# Patient Record
Sex: Female | Born: 1965 | Race: Black or African American | Hispanic: No | Marital: Single | Smoking: Never smoker
Health system: Southern US, Community
[De-identification: ages and names within clinical notes are randomized; demographics above are authoritative.]

## PROBLEM LIST (undated history)

## (undated) DIAGNOSIS — I10 Essential (primary) hypertension: Secondary | ICD-10-CM

## (undated) DIAGNOSIS — K219 Gastro-esophageal reflux disease without esophagitis: Secondary | ICD-10-CM

## (undated) DIAGNOSIS — R519 Headache, unspecified: Secondary | ICD-10-CM

## (undated) DIAGNOSIS — D649 Anemia, unspecified: Secondary | ICD-10-CM

## (undated) DIAGNOSIS — R6883 Chills (without fever): Secondary | ICD-10-CM

## (undated) DIAGNOSIS — R51 Headache: Secondary | ICD-10-CM

## (undated) HISTORY — PX: COLONOSCOPY: SHX174

## (undated) HISTORY — PX: APPENDECTOMY: SHX54

---

## 1995-12-04 HISTORY — PX: OOPHORECTOMY: SHX86

## 1999-07-18 ENCOUNTER — Other Ambulatory Visit: Admission: RE | Admit: 1999-07-18 | Discharge: 1999-07-18 | Payer: Self-pay | Admitting: *Deleted

## 2000-11-15 ENCOUNTER — Other Ambulatory Visit: Admission: RE | Admit: 2000-11-15 | Discharge: 2000-11-15 | Payer: Self-pay | Admitting: *Deleted

## 2001-02-27 ENCOUNTER — Other Ambulatory Visit: Admission: RE | Admit: 2001-02-27 | Discharge: 2001-02-27 | Payer: Self-pay | Admitting: *Deleted

## 2001-03-06 ENCOUNTER — Encounter: Admission: RE | Admit: 2001-03-06 | Discharge: 2001-03-06 | Payer: Self-pay | Admitting: *Deleted

## 2001-03-06 ENCOUNTER — Encounter: Payer: Self-pay | Admitting: *Deleted

## 2001-08-12 ENCOUNTER — Encounter: Admission: RE | Admit: 2001-08-12 | Discharge: 2001-08-12 | Payer: Self-pay | Admitting: Internal Medicine

## 2001-08-12 ENCOUNTER — Encounter: Payer: Self-pay | Admitting: Internal Medicine

## 2001-08-12 ENCOUNTER — Encounter (INDEPENDENT_AMBULATORY_CARE_PROVIDER_SITE_OTHER): Payer: Self-pay | Admitting: *Deleted

## 2001-11-27 ENCOUNTER — Other Ambulatory Visit: Admission: RE | Admit: 2001-11-27 | Discharge: 2001-11-27 | Payer: Self-pay | Admitting: Obstetrics and Gynecology

## 2002-01-21 ENCOUNTER — Encounter: Payer: Self-pay | Admitting: *Deleted

## 2002-01-21 ENCOUNTER — Encounter: Admission: RE | Admit: 2002-01-21 | Discharge: 2002-01-21 | Payer: Self-pay | Admitting: *Deleted

## 2002-05-29 ENCOUNTER — Other Ambulatory Visit: Admission: RE | Admit: 2002-05-29 | Discharge: 2002-05-29 | Payer: Self-pay | Admitting: Obstetrics and Gynecology

## 2002-12-01 ENCOUNTER — Other Ambulatory Visit: Admission: RE | Admit: 2002-12-01 | Discharge: 2002-12-01 | Payer: Self-pay | Admitting: Obstetrics and Gynecology

## 2003-11-16 ENCOUNTER — Other Ambulatory Visit: Admission: RE | Admit: 2003-11-16 | Discharge: 2003-11-16 | Payer: Self-pay | Admitting: Obstetrics and Gynecology

## 2004-11-14 ENCOUNTER — Encounter: Admission: RE | Admit: 2004-11-14 | Discharge: 2004-11-14 | Payer: Self-pay | Admitting: Obstetrics and Gynecology

## 2004-12-11 ENCOUNTER — Encounter: Admission: RE | Admit: 2004-12-11 | Discharge: 2004-12-11 | Payer: Self-pay | Admitting: Obstetrics and Gynecology

## 2006-09-18 ENCOUNTER — Encounter: Admission: RE | Admit: 2006-09-18 | Discharge: 2006-09-18 | Payer: Self-pay | Admitting: *Deleted

## 2007-06-10 ENCOUNTER — Encounter: Admission: RE | Admit: 2007-06-10 | Discharge: 2007-06-10 | Payer: Self-pay | Admitting: *Deleted

## 2007-11-07 ENCOUNTER — Encounter: Admission: RE | Admit: 2007-11-07 | Discharge: 2007-11-07 | Payer: Self-pay | Admitting: Obstetrics and Gynecology

## 2008-11-18 ENCOUNTER — Encounter: Admission: RE | Admit: 2008-11-18 | Discharge: 2008-11-18 | Payer: Self-pay | Admitting: Obstetrics and Gynecology

## 2009-11-21 ENCOUNTER — Encounter: Admission: RE | Admit: 2009-11-21 | Discharge: 2009-11-21 | Payer: Self-pay | Admitting: Obstetrics and Gynecology

## 2010-11-24 ENCOUNTER — Encounter
Admission: RE | Admit: 2010-11-24 | Discharge: 2010-11-24 | Payer: Self-pay | Source: Home / Self Care | Attending: Obstetrics and Gynecology | Admitting: Obstetrics and Gynecology

## 2011-10-18 ENCOUNTER — Other Ambulatory Visit: Payer: Self-pay

## 2011-10-18 ENCOUNTER — Encounter: Payer: Self-pay | Admitting: Cardiology

## 2011-10-18 ENCOUNTER — Emergency Department (HOSPITAL_COMMUNITY)
Admission: EM | Admit: 2011-10-18 | Discharge: 2011-10-18 | Disposition: A | Discharge status: Home / Self Care | Payer: 59 | Source: Home / Self Care | Attending: Family Medicine | Admitting: Family Medicine

## 2011-10-18 DIAGNOSIS — R0781 Pleurodynia: Secondary | ICD-10-CM

## 2011-10-18 DIAGNOSIS — R071 Chest pain on breathing: Secondary | ICD-10-CM

## 2011-10-18 DIAGNOSIS — R0789 Other chest pain: Secondary | ICD-10-CM

## 2011-10-18 MED ORDER — NAPROXEN 500 MG PO TABS: 500.0000 mg | ORAL_TABLET | Freq: Two times a day (BID) | ORAL | Status: AC

## 2011-10-18 NOTE — ED Notes (Addendum)
Along with chest pain as noted in previous note pt reports she had an episode of dizziness Monday that last for 1 1/2 hrs that started at 530pm.  Pt states she has taken motrin 400mg  this morning with no relief. Pt states it hurts her to take a a deep breath. Denies fever. Pt states her initial chest pain at 10pm last night felt like someone was standing on her chest. Pain has eased up but has not gone away. Last night she had increased pain when raising her arms.  Pain is in her chest and goes into the center of her back.

## 2011-10-18 NOTE — ED Notes (Signed)
Pt. states she's been having chest pain since 10pm yesterday. Pt is orient x3 and speaking full sentences.  Pt states she only has pain in the center of the chest, when she breaths in. The pain does not radiate anywhere else. Pt has no cardiac history and is not taking any medications.

## 2011-10-18 NOTE — ED Provider Notes (Signed)
History     CSN: [id number] Arrival date & time: 10/18/2011 10:58 AM   First MD Initiated Contact with Patient 10/18/11 1116      Chief Complaint  Patient presents with  . Chest Pain    (Consider location/radiation/quality/duration/timing/severity/associated sxs/prior treatment) Patient is a 45 y.o. female presenting with chest pain. The history is provided by the patient.  Chest Pain The chest pain began 12 - 24 hours ago. Chest pain occurs constantly. The chest pain is unchanged. The pain is associated with breathing, coughing and exertion. At its most intense, the pain is at 8/10. The pain is currently at 0/10. The quality of the pain is described as dull and aching. The pain radiates to the upper back and mid back. Chest pain is worsened by certain positions, deep breathing and exertion. Primary symptoms include shortness of breath and cough.     History reviewed. No pertinent past medical history.  History reviewed. No pertinent past surgical history.  Family History  Problem Relation Age of Onset  . Hypertension Mother     History  Substance Use Topics  . Smoking status: Never Smoker   . Smokeless tobacco: Not on file  . Alcohol Use: No    OB History    Grav Para Term Preterm Abortions TAB SAB Ect Mult Living                  Review of Systems  Constitutional: Negative.   Respiratory: Positive for cough and shortness of breath.   Cardiovascular: Positive for chest pain.  Genitourinary: Negative.   Musculoskeletal: Negative.     Allergies  Review of patient's allergies indicates no known allergies.  Home Medications  No current outpatient prescriptions on file.  BP 142/79  Pulse 92  Temp 97.9 F (36.6 C)  Resp 20  SpO2 100%  Physical Exam  Constitutional: She appears well-developed and well-nourished.  HENT:  Head: Normocephalic and atraumatic.  Eyes: EOM are normal. Pupils are equal, round, and reactive to light.  Neck: Normal range of  motion.  Cardiovascular: Normal rate, regular rhythm and normal heart sounds.  Exam reveals no gallop and no friction rub.   No murmur heard. Pulmonary/Chest: Effort normal and breath sounds normal. She has no wheezes. She has no rales. She exhibits tenderness.      ED Course  Procedures (including critical care time)  Labs Reviewed - No data to display No results found.   No diagnosis found.    MDM  EKG: normal sinus rhythm, rate 72; no ST-T changes, no TWI        Richardo Priest, MD 10/18/11 1223

## 2011-11-13 ENCOUNTER — Other Ambulatory Visit: Payer: Self-pay | Admitting: Obstetrics and Gynecology

## 2011-11-13 DIAGNOSIS — Z1231 Encounter for screening mammogram for malignant neoplasm of breast: Secondary | ICD-10-CM

## 2011-11-29 ENCOUNTER — Ambulatory Visit
Admission: RE | Admit: 2011-11-29 | Discharge: 2011-11-29 | Disposition: A | Discharge status: Home / Self Care | Payer: 59 | Source: Ambulatory Visit | Attending: Obstetrics and Gynecology | Admitting: Obstetrics and Gynecology

## 2011-11-29 DIAGNOSIS — Z1231 Encounter for screening mammogram for malignant neoplasm of breast: Secondary | ICD-10-CM

## 2011-12-11 ENCOUNTER — Other Ambulatory Visit: Payer: Self-pay | Admitting: Obstetrics and Gynecology

## 2011-12-11 DIAGNOSIS — R928 Other abnormal and inconclusive findings on diagnostic imaging of breast: Secondary | ICD-10-CM

## 2011-12-20 ENCOUNTER — Ambulatory Visit
Admission: RE | Admit: 2011-12-20 | Discharge: 2011-12-20 | Disposition: A | Discharge status: Home / Self Care | Payer: 59 | Source: Ambulatory Visit | Attending: Obstetrics and Gynecology | Admitting: Obstetrics and Gynecology

## 2011-12-20 DIAGNOSIS — R928 Other abnormal and inconclusive findings on diagnostic imaging of breast: Secondary | ICD-10-CM

## 2012-07-01 ENCOUNTER — Encounter (HOSPITAL_BASED_OUTPATIENT_CLINIC_OR_DEPARTMENT_OTHER): Payer: 59

## 2012-11-11 ENCOUNTER — Other Ambulatory Visit: Payer: Self-pay | Admitting: Obstetrics and Gynecology

## 2012-11-11 DIAGNOSIS — Z1231 Encounter for screening mammogram for malignant neoplasm of breast: Secondary | ICD-10-CM

## 2013-10-02 ENCOUNTER — Other Ambulatory Visit: Payer: Self-pay

## 2013-10-02 DIAGNOSIS — Z1231 Encounter for screening mammogram for malignant neoplasm of breast: Secondary | ICD-10-CM

## 2013-11-02 ENCOUNTER — Ambulatory Visit
Admission: RE | Admit: 2013-11-02 | Discharge: 2013-11-02 | Disposition: A | Discharge status: Home / Self Care | Payer: 59 | Source: Ambulatory Visit

## 2013-11-02 DIAGNOSIS — Z1231 Encounter for screening mammogram for malignant neoplasm of breast: Secondary | ICD-10-CM

## 2014-09-07 ENCOUNTER — Other Ambulatory Visit: Payer: Self-pay

## 2014-09-07 DIAGNOSIS — Z1239 Encounter for other screening for malignant neoplasm of breast: Secondary | ICD-10-CM

## 2014-11-04 ENCOUNTER — Ambulatory Visit
Admission: RE | Admit: 2014-11-04 | Discharge: 2014-11-04 | Disposition: A | Discharge status: Home / Self Care | Payer: 59 | Source: Ambulatory Visit

## 2014-11-04 ENCOUNTER — Encounter (INDEPENDENT_AMBULATORY_CARE_PROVIDER_SITE_OTHER): Payer: Self-pay

## 2014-11-04 ENCOUNTER — Other Ambulatory Visit: Payer: Self-pay

## 2014-11-04 DIAGNOSIS — Z1231 Encounter for screening mammogram for malignant neoplasm of breast: Secondary | ICD-10-CM

## 2015-07-26 ENCOUNTER — Other Ambulatory Visit: Payer: Self-pay | Admitting: Certified Nurse Midwife

## 2015-07-26 DIAGNOSIS — D259 Leiomyoma of uterus, unspecified: Secondary | ICD-10-CM

## 2015-09-15 ENCOUNTER — Other Ambulatory Visit: Payer: Self-pay

## 2015-09-23 ENCOUNTER — Ambulatory Visit
Admission: RE | Admit: 2015-09-23 | Discharge: 2015-09-23 | Disposition: A | Discharge status: Home / Self Care | Payer: 59 | Source: Ambulatory Visit | Attending: Certified Nurse Midwife | Admitting: Certified Nurse Midwife

## 2015-09-23 DIAGNOSIS — D259 Leiomyoma of uterus, unspecified: Secondary | ICD-10-CM

## 2015-09-23 MED ORDER — GADOBENATE DIMEGLUMINE 529 MG/ML IV SOLN: 20.0000 mL | Freq: Once | INTRAVENOUS | Status: DC | PRN

## 2015-10-31 ENCOUNTER — Other Ambulatory Visit: Payer: Self-pay

## 2015-10-31 DIAGNOSIS — Z1231 Encounter for screening mammogram for malignant neoplasm of breast: Secondary | ICD-10-CM

## 2015-11-07 ENCOUNTER — Ambulatory Visit
Admission: RE | Admit: 2015-11-07 | Discharge: 2015-11-07 | Disposition: A | Discharge status: Home / Self Care | Payer: 59 | Source: Ambulatory Visit

## 2015-11-07 DIAGNOSIS — Z1231 Encounter for screening mammogram for malignant neoplasm of breast: Secondary | ICD-10-CM

## 2016-09-03 ENCOUNTER — Encounter (HOSPITAL_COMMUNITY): Payer: Self-pay | Admitting: *Deleted

## 2016-09-17 ENCOUNTER — Other Ambulatory Visit: Payer: Self-pay | Admitting: Obstetrics & Gynecology

## 2016-09-19 ENCOUNTER — Encounter (HOSPITAL_COMMUNITY)
Admission: RE | Admit: 2016-09-19 | Discharge: 2016-09-19 | Disposition: A | Discharge status: Home / Self Care | Payer: 59 | Source: Ambulatory Visit | Attending: Obstetrics & Gynecology | Admitting: Obstetrics & Gynecology

## 2016-09-19 ENCOUNTER — Encounter (HOSPITAL_COMMUNITY): Payer: Self-pay

## 2016-09-19 DIAGNOSIS — Z01812 Encounter for preprocedural laboratory examination: Secondary | ICD-10-CM | POA: Insufficient documentation

## 2016-09-19 HISTORY — DX: Anemia, unspecified: D64.9

## 2016-09-19 HISTORY — DX: Gastro-esophageal reflux disease without esophagitis: K21.9

## 2016-09-19 HISTORY — DX: Headache: R51

## 2016-09-19 HISTORY — DX: Essential (primary) hypertension: I10

## 2016-09-19 HISTORY — DX: Chills (without fever): R68.83

## 2016-09-19 HISTORY — DX: Headache, unspecified: R51.9

## 2016-09-19 LAB — CBC
HEMATOCRIT: 35.7 % — AB (ref 36.0–46.0)
Hemoglobin: 11.8 g/dL — ABNORMAL LOW (ref 12.0–15.0)
MCH: 25.1 pg — ABNORMAL LOW (ref 26.0–34.0)
MCHC: 33.1 g/dL (ref 30.0–36.0)
MCV: 76 fL — AB (ref 78.0–100.0)
PLATELETS: 310 10*3/uL (ref 150–400)
RBC: 4.7 MIL/uL (ref 3.87–5.11)
RDW: 16.8 % — AB (ref 11.5–15.5)
WBC: 7.2 10*3/uL (ref 4.0–10.5)

## 2016-09-19 LAB — ABO/RH: ABO/RH(D): A POS

## 2016-09-19 LAB — COMPREHENSIVE METABOLIC PANEL
ALBUMIN: 4.2 g/dL (ref 3.5–5.0)
ALT: 18 U/L (ref 14–54)
AST: 19 U/L (ref 15–41)
Alkaline Phosphatase: 57 U/L (ref 38–126)
Anion gap: 7 (ref 5–15)
BILIRUBIN TOTAL: 0.5 mg/dL (ref 0.3–1.2)
BUN: 11 mg/dL (ref 6–20)
CHLORIDE: 102 mmol/L (ref 101–111)
CO2: 25 mmol/L (ref 22–32)
CREATININE: 0.9 mg/dL (ref 0.44–1.00)
Calcium: 9.2 mg/dL (ref 8.9–10.3)
GFR calc Af Amer: >60 mL/min (ref >60)
GFR calc non Af Amer: >60 mL/min (ref >60)
GLUCOSE: 91 mg/dL (ref 65–99)
POTASSIUM: 3.9 mmol/L (ref 3.5–5.1)
Sodium: 134 mmol/L — ABNORMAL LOW (ref 135–145)
Total Protein: 7.4 g/dL (ref 6.5–8.1)

## 2016-09-19 LAB — TYPE AND SCREEN
ABO/RH(D): A POS
Antibody Screen: NEGATIVE

## 2016-09-19 NOTE — Patient Instructions (Signed)
Your procedure is scheduled on:  Wednesday, Nov. 1, 2017  Enter through the [employer] of [facility] at:  11:30 AM  Pick up the phone at the desk and dial [phone].  Call this number if you have problems the morning of surgery: [phone].  Remember: Do NOT eat food:  After Midnight Tuesday, Oct. 31, 2017  Do NOT drink clear liquids after:  9:00 AM day of surgery  Take these medicines the morning of surgery with a SIP OF WATER:  None  Stop ALL herbal medications at this time   Do NOT wear jewelry (body piercing), metal hair clips/bobby pins, make-up, or nail polish. Do NOT wear lotions, powders, or perfumes.  You may wear deodorant. Do NOT shave for 48 hours prior to surgery. Do NOT bring valuables to the hospital. Contacts, dentures, or bridgework may not be worn into surgery.  Leave suitcase in car.  After surgery it may be brought to your room.  For patients admitted to the hospital, checkout time is 11:00 AM the day of discharge.

## 2016-10-03 ENCOUNTER — Inpatient Hospital Stay (HOSPITAL_COMMUNITY): Payer: 59 | Admitting: Anesthesiology

## 2016-10-03 ENCOUNTER — Encounter (HOSPITAL_COMMUNITY)
Admission: RE | Disposition: A | Discharge status: Home / Self Care | Payer: Self-pay | Source: Ambulatory Visit | Attending: Obstetrics & Gynecology

## 2016-10-03 ENCOUNTER — Inpatient Hospital Stay (HOSPITAL_COMMUNITY)
Admission: RE | Admit: 2016-10-03 | Discharge: 2016-10-06 | DRG: 742 | Disposition: A | Discharge status: Home / Self Care | Payer: 59 | Source: Ambulatory Visit | Attending: Obstetrics & Gynecology | Admitting: Obstetrics & Gynecology

## 2016-10-03 ENCOUNTER — Encounter (HOSPITAL_COMMUNITY): Payer: Self-pay

## 2016-10-03 DIAGNOSIS — K567 Ileus, unspecified: Secondary | ICD-10-CM | POA: Diagnosis not present

## 2016-10-03 DIAGNOSIS — D251 Intramural leiomyoma of uterus: Principal | ICD-10-CM | POA: Diagnosis present

## 2016-10-03 DIAGNOSIS — I10 Essential (primary) hypertension: Secondary | ICD-10-CM | POA: Diagnosis present

## 2016-10-03 DIAGNOSIS — Z90710 Acquired absence of both cervix and uterus: Secondary | ICD-10-CM | POA: Diagnosis present

## 2016-10-03 DIAGNOSIS — K219 Gastro-esophageal reflux disease without esophagitis: Secondary | ICD-10-CM | POA: Diagnosis present

## 2016-10-03 DIAGNOSIS — D259 Leiomyoma of uterus, unspecified: Secondary | ICD-10-CM | POA: Diagnosis present

## 2016-10-03 DIAGNOSIS — Z780 Asymptomatic menopausal state: Secondary | ICD-10-CM | POA: Diagnosis not present

## 2016-10-03 HISTORY — PX: HYSTERECTOMY ABDOMINAL WITH SALPINGECTOMY: SHX6725

## 2016-10-03 SURGERY — HYSTERECTOMY, TOTAL, ABDOMINAL, WITH SALPINGECTOMY
Anesthesia: Spinal | Site: Abdomen | Laterality: Bilateral

## 2016-10-03 MED ORDER — MENTHOL 3 MG MT LOZG: 1.0000 | LOZENGE | OROMUCOSAL | Status: DC | PRN

## 2016-10-03 MED ORDER — ATROPINE SULFATE 0.4 MG/ML IJ SOLN
INTRAMUSCULAR | Status: DC | PRN
  Administered 2016-10-03: 0.4 mg via INTRAVENOUS

## 2016-10-03 MED ORDER — LACTATED RINGERS IV SOLN
INTRAVENOUS | Status: DC
  Administered 2016-10-03 (×3): via INTRAVENOUS

## 2016-10-03 MED ORDER — DEXMEDETOMIDINE HCL IN NACL 200 MCG/50ML IV SOLN
INTRAVENOUS | Status: AC
  Filled 2016-10-03: qty 50

## 2016-10-03 MED ORDER — MORPHINE SULFATE (PF) 0.5 MG/ML IJ SOLN
INTRAMUSCULAR | Status: AC
  Filled 2016-10-03: qty 10

## 2016-10-03 MED ORDER — SENNOSIDES-DOCUSATE SODIUM 8.6-50 MG PO TABS: 1.0000 | ORAL_TABLET | Freq: Every evening | ORAL | Status: DC | PRN

## 2016-10-03 MED ORDER — SODIUM CHLORIDE 0.9 % IV SOLN
INTRAVENOUS | Status: DC
  Filled 2016-10-03: qty 20

## 2016-10-03 MED ORDER — SODIUM CHLORIDE 0.9 % IV SOLN: INTRAVENOUS | Status: DC

## 2016-10-03 MED ORDER — DEXMEDETOMIDINE BOLUS VIA INFUSION
1.0000 ug/kg | Freq: Once | INTRAVENOUS | Status: AC
  Administered 2016-10-03: 50 ug via INTRAVENOUS
  Filled 2016-10-03: qty 104

## 2016-10-03 MED ORDER — FENTANYL 2.5 MCG/ML BUPIVACAINE 1/10 % EPIDURAL INFUSION (WH - ANES)
12.0000 mL/h | INTRAMUSCULAR | Status: DC | PRN
  Administered 2016-10-03: 12 mL/h via EPIDURAL
  Filled 2016-10-03: qty 125

## 2016-10-03 MED ORDER — ACETAMINOPHEN 160 MG/5ML PO SOLN
975.0000 mg | Freq: Once | ORAL | Status: AC
  Administered 2016-10-03: 975 mg via ORAL

## 2016-10-03 MED ORDER — ONDANSETRON HCL 4 MG/2ML IJ SOLN
4.0000 mg | Freq: Four times a day (QID) | INTRAMUSCULAR | Status: DC | PRN
  Administered 2016-10-04: 4 mg via INTRAVENOUS
  Filled 2016-10-03: qty 2

## 2016-10-03 MED ORDER — LIDOCAINE-EPINEPHRINE (PF) 2 %-1:200000 IJ SOLN
INTRAMUSCULAR | Status: DC | PRN
  Administered 2016-10-03: 4 mL
  Administered 2016-10-03: 5 mL
  Administered 2016-10-03: 5 mL via INTRADERMAL
  Administered 2016-10-03: 3 mL
  Administered 2016-10-03: 5 mL
  Administered 2016-10-03: 2 mL
  Administered 2016-10-03: 5 mL via INTRADERMAL
  Administered 2016-10-03 (×2): 5 mL

## 2016-10-03 MED ORDER — CEFAZOLIN SODIUM-DEXTROSE 2-4 GM/100ML-% IV SOLN
INTRAVENOUS | Status: AC
  Filled 2016-10-03: qty 100

## 2016-10-03 MED ORDER — ONDANSETRON HCL 4 MG/2ML IJ SOLN
INTRAMUSCULAR | Status: AC
  Filled 2016-10-03: qty 2

## 2016-10-03 MED ORDER — ACETAMINOPHEN 160 MG/5ML PO SOLN
ORAL | Status: AC
  Administered 2016-10-03: 975 mg via ORAL
  Filled 2016-10-03: qty 40.6

## 2016-10-03 MED ORDER — FENTANYL CITRATE (PF) 100 MCG/2ML IJ SOLN
INTRAMUSCULAR | Status: AC
  Filled 2016-10-03: qty 2

## 2016-10-03 MED ORDER — SCOPOLAMINE 1 MG/3DAYS TD PT72
1.0000 | MEDICATED_PATCH | Freq: Once | TRANSDERMAL | Status: DC
  Administered 2016-10-03: 1.5 mg via TRANSDERMAL

## 2016-10-03 MED ORDER — MIDAZOLAM HCL 5 MG/5ML IJ SOLN
INTRAMUSCULAR | Status: DC | PRN
  Administered 2016-10-03 (×2): 1 mg via INTRAVENOUS
  Administered 2016-10-03: 2 mg via INTRAVENOUS

## 2016-10-03 MED ORDER — MIDAZOLAM HCL 2 MG/2ML IJ SOLN
INTRAMUSCULAR | Status: AC
  Filled 2016-10-03: qty 2

## 2016-10-03 MED ORDER — SCOPOLAMINE 1 MG/3DAYS TD PT72
MEDICATED_PATCH | TRANSDERMAL | Status: AC
  Administered 2016-10-03: 1.5 mg via TRANSDERMAL
  Filled 2016-10-03: qty 1

## 2016-10-03 MED ORDER — BUPIVACAINE IN DEXTROSE 0.75-8.25 % IT SOLN
INTRATHECAL | Status: AC
  Filled 2016-10-03: qty 2

## 2016-10-03 MED ORDER — DEXMEDETOMIDINE HCL IN NACL 200 MCG/50ML IV SOLN
INTRAVENOUS | Status: DC | PRN
  Administered 2016-10-03: 1 ug/kg/h via INTRAVENOUS
  Administered 2016-10-03: 15:00:00 via INTRAVENOUS

## 2016-10-03 MED ORDER — LIDOCAINE-EPINEPHRINE (PF) 2 %-1:200000 IJ SOLN
INTRAMUSCULAR | Status: AC
  Filled 2016-10-03: qty 40

## 2016-10-03 MED ORDER — ATROPINE SULFATE 0.4 MG/ML IJ SOLN
INTRAMUSCULAR | Status: AC
  Filled 2016-10-03: qty 1

## 2016-10-03 MED ORDER — ONDANSETRON HCL 4 MG/2ML IJ SOLN
INTRAMUSCULAR | Status: DC | PRN
  Administered 2016-10-03: 4 mg via INTRAVENOUS

## 2016-10-03 MED ORDER — FENTANYL CITRATE (PF) 100 MCG/2ML IJ SOLN: 25.0000 ug | INTRAMUSCULAR | Status: DC | PRN

## 2016-10-03 MED ORDER — CHLOROPROCAINE HCL (PF) 3 % IJ SOLN
INTRAMUSCULAR | Status: DC | PRN
  Administered 2016-10-03: 20 mL

## 2016-10-03 MED ORDER — DEXAMETHASONE SODIUM PHOSPHATE 4 MG/ML IJ SOLN
INTRAMUSCULAR | Status: DC | PRN
  Administered 2016-10-03: 4 mg via INTRAVENOUS

## 2016-10-03 MED ORDER — ONDANSETRON HCL 4 MG PO TABS
4.0000 mg | ORAL_TABLET | Freq: Four times a day (QID) | ORAL | Status: DC | PRN
  Administered 2016-10-05: 4 mg via ORAL
  Filled 2016-10-03: qty 1

## 2016-10-03 MED ORDER — DEXMEDETOMIDINE HCL IN NACL 200 MCG/50ML IV SOLN: INTRAVENOUS | Status: DC | PRN

## 2016-10-03 MED ORDER — LACTATED RINGERS IV SOLN
INTRAVENOUS | Status: DC
  Administered 2016-10-03 – 2016-10-04 (×2): via INTRAVENOUS

## 2016-10-03 MED ORDER — SODIUM BICARBONATE 8.4 % IV SOLN: INTRAVENOUS | Status: DC | PRN

## 2016-10-03 MED ORDER — FENTANYL CITRATE (PF) 100 MCG/2ML IJ SOLN
INTRAMUSCULAR | Status: DC | PRN
  Administered 2016-10-03 (×3): 50 ug via INTRAVENOUS

## 2016-10-03 MED ORDER — CEFAZOLIN SODIUM-DEXTROSE 2-4 GM/100ML-% IV SOLN
2.0000 g | INTRAVENOUS | Status: AC
  Administered 2016-10-03: 2 g via INTRAVENOUS

## 2016-10-03 MED ORDER — DEXAMETHASONE SODIUM PHOSPHATE 4 MG/ML IJ SOLN
INTRAMUSCULAR | Status: AC
  Filled 2016-10-03: qty 1

## 2016-10-03 MED ORDER — CHLOROPROCAINE HCL (PF) 3 % IJ SOLN
INTRAMUSCULAR | Status: AC
  Filled 2016-10-03: qty 20

## 2016-10-03 MED ORDER — ALUM & MAG HYDROXIDE-SIMETH 200-200-20 MG/5ML PO SUSP
30.0000 mL | ORAL | Status: DC | PRN
  Administered 2016-10-05: 30 mL via ORAL
  Filled 2016-10-03: qty 30

## 2016-10-03 MED ORDER — BUPIVACAINE HCL (PF) 0.25 % IJ SOLN
INTRAMUSCULAR | Status: AC
  Filled 2016-10-03: qty 30

## 2016-10-03 SURGICAL SUPPLY — 36 items
CANISTER SUCT 3000ML (MISCELLANEOUS) ×2 IMPLANT
CLOTH BEACON ORANGE TIMEOUT ST (SAFETY) ×2 IMPLANT
CONT PATH 16OZ SNAP LID 3702 (MISCELLANEOUS) ×2 IMPLANT
DRAPE CESAREAN BIRTH W POUCH (DRAPES) ×2 IMPLANT
DRAPE WARM FLUID 44X44 (DRAPE) ×2 IMPLANT
DRSG OPSITE POSTOP 4X10 (GAUZE/BANDAGES/DRESSINGS) IMPLANT
DRSG OPSITE POSTOP 4X12 (GAUZE/BANDAGES/DRESSINGS) ×2 IMPLANT
DURAPREP 26ML APPLICATOR (WOUND CARE) ×2 IMPLANT
ELECT BLADE 6 FLAT ULTRCLN (ELECTRODE) ×2 IMPLANT
GAUZE SPONGE 4X4 16PLY XRAY LF (GAUZE/BANDAGES/DRESSINGS) ×2 IMPLANT
GLOVE BIO SURGEON STRL SZ7 (GLOVE) ×2 IMPLANT
GLOVE BIOGEL PI IND STRL 7.0 (GLOVE) ×4 IMPLANT
GLOVE BIOGEL PI INDICATOR 7.0 (GLOVE) ×4
GOWN STRL REUS W/TWL LRG LVL3 (GOWN DISPOSABLE) ×8 IMPLANT
NEEDLE HYPO 22GX1.5 SAFETY (NEEDLE) ×2 IMPLANT
NS IRRIG 1000ML POUR BTL (IV SOLUTION) ×2 IMPLANT
PACK ABDOMINAL GYN (CUSTOM PROCEDURE TRAY) ×2 IMPLANT
PAD ABD 7.5X8 STRL (GAUZE/BANDAGES/DRESSINGS) ×2 IMPLANT
PAD OB MATERNITY 4.3X12.25 (PERSONAL CARE ITEMS) ×2 IMPLANT
PROTECTOR NERVE ULNAR (MISCELLANEOUS) ×2 IMPLANT
SPONGE LAP 18X18 X RAY DECT (DISPOSABLE) ×2 IMPLANT
STAPLER VISISTAT 35W (STAPLE) IMPLANT
SUT PLAIN 2 0 XLH (SUTURE) ×2 IMPLANT
SUT PROLENE 0 CT 1 30 (SUTURE) IMPLANT
SUT VIC AB 0 CT1 18XCR BRD8 (SUTURE) ×1 IMPLANT
SUT VIC AB 0 CT1 36 (SUTURE) ×8 IMPLANT
SUT VIC AB 0 CT1 8-18 (SUTURE) ×1
SUT VIC AB 2-0 SH 27 (SUTURE) ×1
SUT VIC AB 2-0 SH 27XBRD (SUTURE) ×1 IMPLANT
SUT VIC AB 4-0 KS 27 (SUTURE) ×2 IMPLANT
SUT VIC AB 4-0 SH 18 (SUTURE) ×2 IMPLANT
SUT VICRYL 0 TIES 12 18 (SUTURE) ×2 IMPLANT
SYR CONTROL 10ML LL (SYRINGE) ×2 IMPLANT
TOWEL OR 17X24 6PK STRL BLUE (TOWEL DISPOSABLE) ×4 IMPLANT
TRAY FOLEY CATH SILVER 14FR (SET/KITS/TRAYS/PACK) ×2 IMPLANT
WATER STERILE IRR 1000ML POUR (IV SOLUTION) IMPLANT

## 2016-10-03 NOTE — Anesthesia Procedure Notes (Signed)
Spinal  Patient location during procedure: OR Preanesthetic Checklist Completed: patient identified, site marked, surgical consent, pre-op evaluation, timeout performed, IV checked, risks and benefits discussed and monitors and equipment checked Spinal Block Patient position: sitting Prep: DuraPrep Patient monitoring: cardiac monitor, continuous pulse ox, blood pressure and heart rate Approach: midline Location: L3-4 Injection technique: catheter Needle Needle type: Tuohy and Sprotte  Needle gauge: 24 G Needle length: 12.7 cm Needle insertion depth: 6 cm Catheter type: closed end flexible Catheter size: 19 g Catheter at skin depth: 13 cm Additional Notes Spinal Dosage in OR  Bupivicaine ml       1.5 PFMS04   mcg        100  (-) asp on catheter

## 2016-10-03 NOTE — Anesthesia Postprocedure Evaluation (Signed)
Anesthesia Post Note  Patient: BAYLYNN NARDUCCI  Procedure(s) Performed: Procedure(s) (LRB): HYSTERECTOMY ABDOMINAL WITH Left Salpingo oophrectomy  (Bilateral)  Patient location during evaluation: PACU Anesthesia Type: Combined Spinal/Epidural Level of consciousness: awake and alert, oriented and patient cooperative Pain management: pain level controlled Vital Signs Assessment: post-procedure vital signs reviewed and stable Respiratory status: spontaneous breathing, nonlabored ventilation and respiratory function stable Cardiovascular status: blood pressure returned to baseline and stable Postop Assessment: no headache, epidural receding, spinal receding, patient able to bend at knees and no signs of nausea or vomiting Anesthetic complications: no     Last Vitals:  Vitals:   10/03/16 1914 10/03/16 1930  BP:  98/67  Pulse:  (!) 58  Resp: 14 10  Temp:      Last Pain:  Vitals:   10/03/16 1914  TempSrc:   PainSc: 0-No pain   Pain Goal: Patients Stated Pain Goal: 3 (10/03/16 1900)               Seleta Rhymes. Keiandre Cygan

## 2016-10-03 NOTE — Op Note (Signed)
PRE-OPERATIVE DIAGNOSIS:  Uterine Fibroids  [address]  POST-OPERATIVE DIAGNOSIS:  Uterine Fibroids ( specimen weight 500 gm)  PROCEDURE:  TOTAL ABDOMINAL HYSTERECTOMY, LEFT SALPINGO-OPHORECTOMY  SURGEON: Elveria Royals, MD  CO-SURGEON:  Aloha Gell, MD  ANESTHESIA:  Spinal- Epidural EBL: 400 cc  IVF: 2500 cc LR   Urine output: 300 cc clear urine in foley  BLOOD ADMINISTERED: none  LOCAL MEDICATIONS USED:  None  SPECIMEN:  Urerus, cervix, left tube and ovary  DISPOSITION OF SPECIMEN:  PATHOLOGY  COUNTS:  YES  PATIENT DISPOSITION:  PACU - hemodynamically stable. Then admit for post-op care.   Delay start of Pharmacological VTE agent (>24hrs) due to surgical blood loss or risk of bleeding: yes  PROCEDURE:   Indication:  Symptomatic uterine fibroids with abdominal mass, pain and pressure. Patient declined laparoscopic surgery due to concern about uterine morcellation and chose to have abdominal hysterectomy. Salpingectomy data on ovarian cancer risk reduction was reviewed and she agreed. We also discussed removing left (her only remaining ovary) if there was any concern, especially she is in early menopause by hormone evaluation and amenorrhea 2 yrs.  Risks and complications of surgery including infection, bleeding, damage to internal organs and other including but not limited to surgery related problems including pneumonia, VTE reviewed. Informed written consent was obtained.   Patient was brought to the operating room with IV running. She received 2 gm Ancef. Underwent Spinal-epidural anesthesia without difficulty and was given dorsal supine position, prepped and draped in sterile fashion. Foley catheter was placed. Exam under anesthesia noted uterus about 14 wks. Pfannenstiel incision was made with scalpel and carried down to the underlying fascia with Bovie with excellent hemostasis.  Fascia incised and extended laterally. Fascia grasped with Kocher's and underlying rectus muscles  were dissected down. Rectus muscles were adherent in midline from prior laparotomy. Careful dissection done to find clear plane and peritoneal entry made after dissecting omental adhesions and incision extended superiorly and inferiorly. Sight adhesions noted on pelvis between bowel and peritoneum and posterior uterine wall and also left tube and ovary. After pelvis was cleared of adhesions, bowel and omentum packing attempted, intraperitoneal 0.25% marcaine instilled as patient was feeling pain with peritoneal manipulation. Packing completed with 5 lap sponges and Balfour retractor was placed and secured. Large fibroid uterus was palpated above pelvic brim but mobility was restricted. Bilateral round ligaments were grasped one after another and incised and suture ligated with 0 Vicryl and mosquito clamps placed at the ends. Anterior broad ligament was dissected and cut to creat bladder flap. Bladder was pushed further away with blunt dissection with sponge stick. Right tube and ovary were absent. Left cornual clamp placed x2 and incised, left tube and ovary were freed from the uterus and suture transfixed, bleeding noted from below the pedicle and it was secured with right angle clamp and suture tied. Posterior and anterior broad ligament leaves dissected and bladder flap completed. Posterior broad ligament was dissected bilaterally and uterine vessels were skeletonized. Bilateral uterine vessels were clamped with Heaney clamps and cut and transfixed with 0 Vicryl. Uterus was amputated above the cervix and passed off and cervical stump was grasped with Kocher. Hemostasis was observed. Straight Heaney clamps applied bilaterally, Cardinal ligaments cut and transfixed with 0 Vicryl. Bilateral curved Heaney's applied to vaginal angles with Uterosacral ligament and cut and transfixed. Vaginal opening noted, was cut circumferentially and cervix was handed off. Vaginal cut was closed from one angle to the other with 0  Vicryl interlocking sutures  with excellent hemostasis. Left salpingectomy attempted and was converted to salpingophorectomy due to edematous thick tube and bleeding from friable IP ligament and ovary and left tube and ovary were handed off, pedicle was suture transfixed x 2 due to bleeding. All pedicles appeared dry. Vaginal cuff had some bleeding and intermittent 0-Vicryl sutures placed for hemostasis. Suction irrigation done. Hemostasis observed. Lap sponges and Balfour retractor removed. Fascia sutured with 0 Vicryl from two ends and met in midline. Subcutaneous layer was deep and closed with 2-0 Plain gut. Skin approximated with 4-0 Vicryl in subcuticular fashion.  Sterile dressing placed.  Sterile Honeycomb dressing placed.  All  Instruments/ lap/ sponges counts were correct x2. No complications.   Dr Benjie Karvonen was the surgeon for entire case.   V.Mariapaula Krist, MD

## 2016-10-03 NOTE — Transfer of Care (Signed)
Immediate Anesthesia Transfer of Care Note  Patient: Marilyn Grimes  Procedure(s) Performed: Procedure(s): HYSTERECTOMY ABDOMINAL WITH SALPINGECTOMY (Bilateral)  Patient Location: PACU  Anesthesia Type:Spinal and Epidural  Level of Consciousness: awake, alert  and oriented  Airway & Oxygen Therapy: Patient Spontanous Breathing and Patient connected to nasal cannula oxygen  Post-op Assessment: Report given to RN and Post -op Vital signs reviewed and stable  Post vital signs: Reviewed and stable  Last Vitals:  Vitals:   10/03/16 1125  BP: (!) 155/110  Pulse: 95  Resp: 20  Temp: 37 C    Last Pain:  Vitals:   10/03/16 1125  TempSrc: Oral      Patients Stated Pain Goal: 3 ([phone])  Complications: No apparent anesthesia complications

## 2016-10-03 NOTE — H&P (Signed)
Marilyn Grimes is an 50 y.o. female menopausal female with fibroids, causing pelvic pain, pressure, back pain and increased girth. Denies bleeding.  No abnormal Pap hx. No breast complaints. Unilateral oophorectomy in past.  No LMP recorded. Patient is postmenopausal.    Past Medical History:  Diagnosis Date  . Anemia   . Chills   . GERD (gastroesophageal reflux disease)   . Headache    Migraines  . Hypertension    history of    Past Surgical History:  Procedure Laterality Date  . APPENDECTOMY    . COLONOSCOPY    . OOPHORECTOMY      Family History  Problem Relation Age of Onset  . Hypertension Mother     Social History:  reports that she has never smoked. She has never used smokeless tobacco. She reports that she drinks alcohol. She reports that she does not use drugs.  Allergies: No Known Allergies  Prescriptions Prior to Admission  Medication Sig Dispense Refill Last Dose  . BLACK CURRANT SEED OIL PO Take 5 mLs by mouth daily.   Past Month at Unknown time  . Naproxen Sodium (ALEVE) 220 MG CAPS Take 440 mg by mouth 2 (two) times daily as needed (headache).   Past Month at Unknown time    ROS  Blood pressure (!) 155/110, pulse 95, temperature 98.6 F (37 C), temperature source Oral, resp. rate 20, SpO2 98 %. Physical Exam Physical exam:  A&O x 3, no acute distress. Pleasant HEENT neg, no thyromegaly Lungs CTA bilat CV RRR, S1S2 normal Abdo soft, non tender, non acute. Enlarged uterus with fibroids,  Extr no edema/ tenderness Pelvic Enlarged uterus with 16-18 wk uterus   CBC    Component Value Date/Time   WBC 7.2 09/19/2016 1550   RBC 4.70 09/19/2016 1550   HGB 11.8 (L) 09/19/2016 1550   HCT 35.7 (L) 09/19/2016 1550   PLT 310 09/19/2016 1550   MCV 76.0 (L) 09/19/2016 1550   MCH 25.1 (L) 09/19/2016 1550   MCHC 33.1 09/19/2016 1550   RDW 16.8 (H) 09/19/2016 1550   Office sono    Assessment/Plan: 50 yo female with symptomatic uterine fibroids causing  mass effect. Planning Abdominal hysterectomy as pt declines morcellation. LDH isoenzymes normal 2016 and recent but she prefers not have the risk from morcellation.  LMP 2 yrs menopausal status on lab tests as well but prefers ovarian conservation (has one ovary only) if possible.   Risks/complications of surgery reviewed incl infection, bleeding, damage to internal organs including bladder, bowels, ureters, blood vessels, other risks from anesthesia, VTE and delayed complications of any surgery, complications in future surgery reviewed.   Albeiro Trompeter R 10/03/2016, 12:46 PM

## 2016-10-03 NOTE — Anesthesia Preprocedure Evaluation (Addendum)
Anesthesia Evaluation  Patient identified by MRN, date of birth, ID band Patient awake    Reviewed: Allergy & Precautions, H&P , Patient's Chart, lab work & pertinent test results, reviewed documented beta blocker date and time   Airway Mallampati: II  TM Distance: >3 FB Neck ROM: full    Dental no notable dental hx.    Pulmonary    Pulmonary exam normal breath sounds clear to auscultation       Cardiovascular hypertension,  Rhythm:regular Rate:Normal     Neuro/Psych    GI/Hepatic GERD  ,  Endo/Other    Renal/GU      Musculoskeletal   Abdominal   Peds  Hematology   Anesthesia Other Findings No increased BP's at Mody's office. Normal EKG Denies Chest pain or CAD Sx  Reproductive/Obstetrics                            Anesthesia Physical Anesthesia Plan  ASA: III  Anesthesia Plan: Combined Spinal and Epidural   Post-op Pain Management:    Induction:   Airway Management Planned:   Additional Equipment:   Intra-op Plan:   Post-operative Plan:   Informed Consent: I have reviewed the patients History and Physical, chart, labs and discussed the procedure including the risks, benefits and alternatives for the proposed anesthesia with the patient or authorized representative who has indicated his/her understanding and acceptance.   Dental Advisory Given  Plan Discussed with: CRNA and Surgeon  Anesthesia Plan Comments: (Lab work confirmed with CRNA in room. Platelets okay. Discussed spinal anesthetic, and patient consents to the procedure:  included risk of possible headache,backache, failed block, allergic reaction, and nerve injury. This patient was asked if she had any questions or concerns before the procedure started. )       Anesthesia Quick Evaluation

## 2016-10-04 ENCOUNTER — Encounter (HOSPITAL_COMMUNITY): Payer: Self-pay | Admitting: Obstetrics & Gynecology

## 2016-10-04 LAB — BASIC METABOLIC PANEL
Anion gap: 5 (ref 5–15)
BUN: 10 mg/dL (ref 6–20)
CALCIUM: 8.3 mg/dL — AB (ref 8.9–10.3)
CO2: 26 mmol/L (ref 22–32)
CREATININE: 0.74 mg/dL (ref 0.44–1.00)
Chloride: 107 mmol/L (ref 101–111)
GFR calc non Af Amer: >60 mL/min (ref >60)
Glucose, Bld: 98 mg/dL (ref 65–99)
Potassium: 4 mmol/L (ref 3.5–5.1)
SODIUM: 138 mmol/L (ref 135–145)

## 2016-10-04 LAB — CBC
HCT: 29.1 % — ABNORMAL LOW (ref 36.0–46.0)
Hemoglobin: 9.7 g/dL — ABNORMAL LOW (ref 12.0–15.0)
MCH: 25.3 pg — ABNORMAL LOW (ref 26.0–34.0)
MCHC: 33.3 g/dL (ref 30.0–36.0)
MCV: 75.8 fL — ABNORMAL LOW (ref 78.0–100.0)
PLATELETS: 246 10*3/uL (ref 150–400)
RBC: 3.84 MIL/uL — AB (ref 3.87–5.11)
RDW: 16.9 % — AB (ref 11.5–15.5)
WBC: 9.1 10*3/uL (ref 4.0–10.5)

## 2016-10-04 MED ORDER — ACETAMINOPHEN 160 MG/5ML PO SOLN: 975.0000 mg | Freq: Once | ORAL | Status: AC

## 2016-10-04 MED ORDER — OXYCODONE-ACETAMINOPHEN 5-325 MG PO TABS
1.0000 | ORAL_TABLET | ORAL | Status: DC | PRN
  Administered 2016-10-04 (×3): 2 via ORAL
  Administered 2016-10-05 – 2016-10-06 (×2): 1 via ORAL
  Filled 2016-10-04: qty 2
  Filled 2016-10-04: qty 1
  Filled 2016-10-04: qty 2
  Filled 2016-10-04: qty 1
  Filled 2016-10-04: qty 2
  Filled 2016-10-04: qty 1

## 2016-10-04 MED ORDER — DOCUSATE SODIUM 100 MG PO CAPS
100.0000 mg | ORAL_CAPSULE | Freq: Two times a day (BID) | ORAL | Status: DC
  Administered 2016-10-04 – 2016-10-06 (×4): 100 mg via ORAL
  Filled 2016-10-04 (×5): qty 1

## 2016-10-04 MED ORDER — FENTANYL 2.5 MCG/ML BUPIVACAINE 1/10 % EPIDURAL INFUSION (WH - ANES)
12.0000 mL/h | INTRAMUSCULAR | Status: DC | PRN
  Administered 2016-10-04: 12 mL/h via EPIDURAL
  Filled 2016-10-04 (×3): qty 125

## 2016-10-04 NOTE — Progress Notes (Signed)
[facility] notified about patient's epidural medication being discountinued by another provider. Order was reviewed and medication was reordered. Will keep monitoring patient.

## 2016-10-04 NOTE — Addendum Note (Signed)
Addendum  created 10/04/16 0012 by Annye Asa, MD   Order sets accessed

## 2016-10-04 NOTE — Anesthesia Postprocedure Evaluation (Signed)
Anesthesia Post Note  Patient: Marilyn Grimes  Procedure(s) Performed: Procedure(s) (LRB): HYSTERECTOMY ABDOMINAL WITH Left Salpingo oophrectomy  (Bilateral)  Patient location during evaluation: [facility] Anesthesia Type: General Level of consciousness: awake and alert, oriented and patient cooperative Pain management: pain level controlled Vital Signs Assessment: post-procedure vital signs reviewed and stable Respiratory status: spontaneous breathing Cardiovascular status: stable Postop Assessment: no signs of nausea or vomiting, adequate PO intake and no headache Anesthetic complications: no     Last Vitals:  Vitals:   10/04/16 1400 10/04/16 1731  BP: (!) 111/56 (!) 104/53  Pulse: 66 61  Resp: 18 18  Temp: 37.4 C 36.8 C    Last Pain:  Vitals:   10/04/16 1731  TempSrc: Oral  PainSc:    Pain Goal: Patients Stated Pain Goal: 3 (10/04/16 0930)               Francis Dowse J

## 2016-10-04 NOTE — Progress Notes (Signed)
1 Day Post-Op Procedure(s) (LRB): HYSTERECTOMY ABDOMINAL WITH Left Salpingo oophrectomy   Subjective: Patient reports tolerating PO and + flatus.  Pain well controlled. Slept well. No SOB/CP  Objective: I have reviewed patient's vital signs, intake and output, medications and labs. BP 107/81 (BP Location: Right Arm)   Pulse 62   Temp 99 F (37.2 C) (Oral)   Resp 16   Ht 5\' 4"  (1.626 m)   Wt 228 lb (103.4 kg)   SpO2 98%   BMI 39.14 kg/m   General: alert and cooperative Resp: clear to auscultation bilaterally Cardio: regular rate and rhythm, S1, S2 normal, no murmur, click, rub or gallop GI: soft, non-tender; bowel sounds normal; no masses,  no organomegaly Extremities: extremities normal, atraumatic, no cyanosis or edema and Homans sign is negative, no sign of DVT  CBC Latest Ref Rng & Units 10/04/2016 09/19/2016  WBC 4.0 - 10.5 K/uL 9.1 7.2  Hemoglobin 12.0 - 15.0 g/dL 9.7(L) 11.8(L)  Hematocrit 36.0 - 46.0 % 29.1(L) 35.7(L)  Platelets 150 - 400 K/uL 246 310    Assessment/ Plan: s/p TAH/ LSO. Post-op day 1. Using epidural analgesia post-op, did very well, denies pain. Tolerated fluids. Advance to regular diet, ambulate and foley out after epidural turned off per Anesthesia team advise.  CBC ok, start iron tomorrow and cont for 3 mos.  OR findings reviewed.  Reassess left leg sensation after epidural turned off.    LOS: 1 day    Marilyn Grimes R 10/04/2016, 8:33 AM

## 2016-10-04 NOTE — Addendum Note (Signed)
Addendum  created 10/04/16 1924 by Enis Slipper, CRNA   Sign clinical note

## 2016-10-05 NOTE — Addendum Note (Signed)
Addendum  created 10/05/16 1223 by Raenette Rover, CRNA   Charge Capture section accepted

## 2016-10-05 NOTE — Progress Notes (Signed)
Subjective: No appetite , vomited last night, none since. No abdo pain. Passed some flatus.   Objective: Vital signs in last 24 hours: Temp:  [97.8 F (36.6 C)-99.4 F (37.4 C)] 98.7 F (37.1 C) (11/03 0627) Pulse Rate:  [61-89] 73 (11/03 0627) Resp:  [16-18] 16 (11/03 0627) BP: (96-136)/(53-72) 136/72 (11/03 0627) SpO2:  [96 %-99 %] 98 % (11/03 0627) Weight change:  Last BM Date: 10/02/16  Intake/Output from previous day: 11/02 0701 - 11/03 0700 In: 1652 [P.O.:940; I.V.:712] Out: 2275 [Urine:1825; Emesis/NG output:450] Intake/Output this shift: Total I/O In: 155 [P.O.:120; I.V.:35] Out: 1000 [Urine:550; Emesis/NG output:450]  Physical exam:  A&O x 3, no acute distress. Pleasant HEENT neg Lungs CTA bilat CV RRR, S1S2 normal Abdo soft, non tender, non acute, Hypoactive bowel sounds. Dressing removed, honeycomb slightly moist/blood stained Extr no edema/ tenderness Pelvic deferred , no reported bleeding  Lab Results:  Recent Labs  10/04/16 0627  WBC 9.1  HGB 9.7*  HCT 29.1*  PLT 246   BMET  Recent Labs  10/04/16 0627  NA 138  K 4.0  CL 107  CO2 26  GLUCOSE 98  BUN 10  CREATININE 0.74  CALCIUM 8.3*    Assessment/Plan:  LOS: 2 days  Post-op, having GI problems, poss early ileum, continue diet as tolerated. Needs to remain in pt.   Tyquan Carmickle R 10/05/2016, 6:53 AM  Patient ID: Marilyn Grimes, female   DOB: 10-Feb-1966, 50 y.o.   MRN: [id number]

## 2016-10-05 NOTE — Progress Notes (Signed)
Assumed care from Edmonia Caprio, an Therapist, sports.... Will continue to monitor patient.

## 2016-10-06 LAB — CBC WITH DIFFERENTIAL/PLATELET
Basophils Absolute: 0 10*3/uL (ref 0.0–0.1)
Basophils Relative: 0 %
Eosinophils Absolute: 0.1 10*3/uL (ref 0.0–0.7)
Eosinophils Relative: 1 %
HEMATOCRIT: 31.9 % — AB (ref 36.0–46.0)
HEMOGLOBIN: 10.5 g/dL — AB (ref 12.0–15.0)
LYMPHS ABS: 2.1 10*3/uL (ref 0.7–4.0)
LYMPHS PCT: 25 %
MCH: 25 pg — AB (ref 26.0–34.0)
MCHC: 32.9 g/dL (ref 30.0–36.0)
MCV: 76 fL — AB (ref 78.0–100.0)
MONOS PCT: 4 %
Monocytes Absolute: 0.3 10*3/uL (ref 0.1–1.0)
NEUTROS ABS: 6 10*3/uL (ref 1.7–7.7)
NEUTROS PCT: 70 %
Platelets: 291 10*3/uL (ref 150–400)
RBC: 4.2 MIL/uL (ref 3.87–5.11)
RDW: 16.8 % — ABNORMAL HIGH (ref 11.5–15.5)
WBC: 8.6 10*3/uL (ref 4.0–10.5)

## 2016-10-06 LAB — COMPREHENSIVE METABOLIC PANEL
ALK PHOS: 45 U/L (ref 38–126)
ALT: 15 U/L (ref 14–54)
ANION GAP: 8 (ref 5–15)
AST: 17 U/L (ref 15–41)
Albumin: 3.5 g/dL (ref 3.5–5.0)
BILIRUBIN TOTAL: 0.4 mg/dL (ref 0.3–1.2)
BUN: 8 mg/dL (ref 6–20)
CALCIUM: 8.2 mg/dL — AB (ref 8.9–10.3)
CO2: 25 mmol/L (ref 22–32)
CREATININE: 0.71 mg/dL (ref 0.44–1.00)
Chloride: 103 mmol/L (ref 101–111)
GFR calc non Af Amer: >60 mL/min (ref >60)
Glucose, Bld: 88 mg/dL (ref 65–99)
Potassium: 3.6 mmol/L (ref 3.5–5.1)
Sodium: 136 mmol/L (ref 135–145)
TOTAL PROTEIN: 7.2 g/dL (ref 6.5–8.1)

## 2016-10-06 MED ORDER — IBUPROFEN 200 MG PO TABS: 600.0000 mg | ORAL_TABLET | Freq: Four times a day (QID) | ORAL | 0 refills | Status: AC | PRN

## 2016-10-06 MED ORDER — DOCUSATE SODIUM 100 MG PO CAPS: 100.0000 mg | ORAL_CAPSULE | Freq: Two times a day (BID) | ORAL | 0 refills | Status: DC

## 2016-10-06 MED ORDER — SENNOSIDES-DOCUSATE SODIUM 8.6-50 MG PO TABS: 1.0000 | ORAL_TABLET | Freq: Every evening | ORAL | 0 refills | Status: DC | PRN

## 2016-10-06 MED ORDER — OXYCODONE-ACETAMINOPHEN 5-325 MG PO TABS: 1.0000 | ORAL_TABLET | Freq: Four times a day (QID) | ORAL | 0 refills | Status: DC | PRN

## 2016-10-06 NOTE — Discharge Summary (Signed)
Physician Discharge Summary  Patient ID: Marilyn Grimes MRN: [id number] DOB/AGE: 05-19-1966 50 y.o.  Admit date: 10/03/2016 Discharge date: 10/06/2016  Admission Diagnoses:  Symptomatic uterine fibroid    Discharge Diagnoses: Total abdominal hysterectomy and left salpingophorectomy (TAH, LSO)  Discharged Condition: Improved.  Hospital Course: 50 yo female who is menopausal but has symptomatic fibroids due to mass effect, pressure and pain. She underwent TAH/LSO on [id number] without complication under spinal-epidural anesthesia and epidural was used for post-operative pain control for 24 hours. She did well on Post-op day 1 but had vomiting at night and didn't eat solid foods all of post-op day2 and mild ileus was suspected. She didn't have further emesis and on POD#3 she had active bowel sounds, emesis resolved, appetite returned, she was passing flatus and tolerating general diet. Pain control was excellent, she was ambulating well. No evidence of DVT on exam. So she was discharged home with post-op insructions  Discharge Exam: Blood pressure 130/81, pulse 86, temperature 98.6 F (37 C), temperature source Oral, resp. rate 17, height 5\' 4"  (1.626 m), weight 228 lb (103.4 kg), SpO2 96 %. General appearance: alert and cooperative Resp: clear to auscultation bilaterally Cardio: regular rate and rhythm, S1, S2 normal, no murmur, click, rub or gallop GI: soft, non-tender; bowel sounds normal; no masses,  no organomegaly Incision/Wound: intact, no infection  Disposition: 01-Home or Self Care  Discharge Instructions    Call MD for:    Complete by:  As directed    Moderate vaginal bleeding that is more than just some spotting   Call MD for:  difficulty breathing, headache or visual disturbances    Complete by:  As directed    Call MD for:  extreme fatigue    Complete by:  As directed    Call MD for:  hives    Complete by:  As directed    Call MD for:  persistant dizziness or  light-headedness    Complete by:  As directed    Call MD for:  persistant nausea and vomiting    Complete by:  As directed    Call MD for:  redness, tenderness, or signs of infection (pain, swelling, redness, odor or green/yellow discharge around incision site)    Complete by:  As directed    Call MD for:  severe uncontrolled pain    Complete by:  As directed    Call MD for:  temperature >100.4    Complete by:  As directed    Diet - low sodium heart healthy    Complete by:  As directed    Driving Restrictions    Complete by:  As directed    2-3 weeks   Increase activity slowly    Complete by:  As directed    Lifting restrictions    Complete by:  As directed    20 lbs only for 6 weeks   Sexual Activity Restrictions    Complete by:  As directed    None for 6 weeks       Medication List    TAKE these medications   ALEVE 220 MG Caps Generic drug:  Naproxen Sodium Take 440 mg by mouth 2 (two) times daily as needed (headache).   BLACK CURRANT SEED OIL PO Take 5 mLs by mouth daily.   docusate sodium 100 MG capsule Commonly known as:  COLACE Take 1 capsule (100 mg total) by mouth 2 (two) times daily.   ibuprofen 200 MG tablet Commonly known as:  ADVIL  Take 3 tablets (600 mg total) by mouth every 6 (six) hours as needed.   oxyCODONE-acetaminophen 5-325 MG tablet Commonly known as:  PERCOCET/ROXICET Take 1-2 tablets by mouth every 6 (six) hours as needed for moderate pain.   senna-docusate 8.6-50 MG tablet Commonly known as:  Senokot-S Take 1 tablet by mouth at bedtime as needed for mild constipation.      Follow-up Information    Gabbrielle Mcnicholas R, MD Follow up in 2 week(s).   Specialty:  Obstetrics and Gynecology Contact information: [address] [place] [address] [phone]           Signed: Elveria Royals 10/06/2016, 9:11 AM

## 2016-10-06 NOTE — Progress Notes (Signed)
Subjective: Feels better, has some appetite, drank only liquids and ate a few grapes yesterday. No emesis. Passing flatus. Ambulating. No back pain.   Objective: Vital signs in last 24 hours: Temp:  [98.6 F (37 C)-98.8 F (37.1 C)] 98.6 F (37 C) (11/04 0543) Pulse Rate:  [67-86] 86 (11/04 0543) Resp:  [15-17] 17 (11/04 0543) BP: (130-151)/(74-84) 130/81 (11/04 0543) SpO2:  [95 %-98 %] 96 % (11/04 0543) Weight change:  Last BM Date: 10/02/16  General appearance: alert and cooperative Resp: clear to auscultation bilaterally Cardio: regular rate and rhythm, S1, S2 normal, no murmur, click, rub or gallop GI: normal findings: bowel sounds normal Extremities: Homans sign is negative, no sign of DVT Incision/Wound: dressing moist, change after shower Abdo soft, not distended and active bowel sounds noted.   Lab Results: CBC    Component Value Date/Time   WBC 8.6 10/06/2016 0746   RBC 4.20 10/06/2016 0746   HGB 10.5 (L) 10/06/2016 0746   HCT 31.9 (L) 10/06/2016 0746   PLT 291 10/06/2016 0746   MCV 76.0 (L) 10/06/2016 0746   MCH 25.0 (L) 10/06/2016 0746   MCHC 32.9 10/06/2016 0746   RDW 16.8 (H) 10/06/2016 0746   LYMPHSABS 2.1 10/06/2016 0746   MONOABS 0.3 10/06/2016 0746   EOSABS 0.1 10/06/2016 0746   BASOSABS 0.0 10/06/2016 0746   CMP     Component Value Date/Time   NA 136 10/06/2016 0746   K 3.6 10/06/2016 0746   CL 103 10/06/2016 0746   CO2 25 10/06/2016 0746   GLUCOSE 88 10/06/2016 0746   BUN 8 10/06/2016 0746   CREATININE 0.71 10/06/2016 0746   CALCIUM 8.2 (L) 10/06/2016 0746   PROT 7.2 10/06/2016 0746   ALBUMIN 3.5 10/06/2016 0746   AST 17 10/06/2016 0746   ALT 15 10/06/2016 0746   ALKPHOS 45 10/06/2016 0746   BILITOT 0.4 10/06/2016 0746   GFRNONAA >60 10/06/2016 0746   GFRAA >60 10/06/2016 0746     Assessment/Plan: TAH/LSO, post-op day 3. No evidence of ileus on exam. Labs stable. Proceed with general diet. Dressing change, shower. If diet tolerated  well, discharge home. F/up n office in 2 wks. Post-op care reviewed, warning s/s fever/ vomiting/ heavy vaginal bleeding/ pain not controlled reviewed.   . LOS: 3 days   Keyasia Jolliff R 10/06/2016, 7:41 AM

## 2016-11-01 ENCOUNTER — Other Ambulatory Visit: Payer: Self-pay | Admitting: Certified Nurse Midwife

## 2016-11-01 DIAGNOSIS — Z1231 Encounter for screening mammogram for malignant neoplasm of breast: Secondary | ICD-10-CM

## 2016-11-28 ENCOUNTER — Ambulatory Visit: Payer: 59

## 2016-11-29 ENCOUNTER — Ambulatory Visit
Admission: RE | Admit: 2016-11-29 | Discharge: 2016-11-29 | Disposition: A | Discharge status: Home / Self Care | Payer: 59 | Source: Ambulatory Visit | Attending: Certified Nurse Midwife | Admitting: Certified Nurse Midwife

## 2016-11-29 DIAGNOSIS — Z1231 Encounter for screening mammogram for malignant neoplasm of breast: Secondary | ICD-10-CM

## 2017-07-31 ENCOUNTER — Encounter: Payer: Self-pay | Admitting: Diagnostic Neuroimaging

## 2017-07-31 ENCOUNTER — Ambulatory Visit (INDEPENDENT_AMBULATORY_CARE_PROVIDER_SITE_OTHER): Payer: 59 | Admitting: Diagnostic Neuroimaging

## 2017-07-31 VITALS — BP 156/97 | HR 89 | Ht 65.0 in | Wt 230.0 lb

## 2017-07-31 DIAGNOSIS — R202 Paresthesia of skin: Secondary | ICD-10-CM

## 2017-07-31 DIAGNOSIS — G441 Vascular headache, not elsewhere classified: Secondary | ICD-10-CM | POA: Diagnosis not present

## 2017-07-31 DIAGNOSIS — R200 Anesthesia of skin: Secondary | ICD-10-CM

## 2017-07-31 DIAGNOSIS — M546 Pain in thoracic spine: Secondary | ICD-10-CM | POA: Diagnosis not present

## 2017-07-31 DIAGNOSIS — M5414 Radiculopathy, thoracic region: Secondary | ICD-10-CM | POA: Diagnosis not present

## 2017-07-31 DIAGNOSIS — G43009 Migraine without aura, not intractable, without status migrainosus: Secondary | ICD-10-CM

## 2017-07-31 DIAGNOSIS — G8929 Other chronic pain: Secondary | ICD-10-CM

## 2017-07-31 MED ORDER — GABAPENTIN 300 MG PO CAPS: 300.0000 mg | ORAL_CAPSULE | Freq: Two times a day (BID) | ORAL | 6 refills | Status: DC

## 2017-07-31 NOTE — Progress Notes (Signed)
[facility]  PATIENT: Marilyn Grimes DOB: [birth year]  REFERRING CLINICIAN: A Ashby Dawes / Blair Dolphin HISTORY FROM: patient  REASON FOR VISIT: new consult    HISTORICAL  CHIEF COMPLAINT:  Chief Complaint  Patient presents with  . Migraine    rm 6, New Pt, "frequent headaches/migraines since Jan; tingling in legs/right arm and head; back pain"    HISTORY OF PRESENT ILLNESS:   51 year old female here for evaluation of headaches, right thoracic pain, bilateral foot numbness and tingling.  Regarding headaches patient has had headaches and migraine symptoms since age 69 years old, but never officially diagnosed. Since January 2018 headaches have significantly increased in frequency. She describes unilateral pressure and pounding sensation, sometimes associated with chills sensation, with nausea, vomiting, photophobia. No specific triggering factors. Now headaches are occurring almost one time per week. She is tried over-the-counter medication without relief. This represents a change in her headache pattern.  Regarding right upper thoracic symptoms, patient developed tingling sensation in June 2018 near her right latissimus, axillary region, wrapping around her right chest. She developed itching sensation. She developed burning sensation. She was concerned about possible shingles infection although she never noticed a rash. Symptoms now are burning and painful.  Regarding her bilateral feet numbness, this started intermittently in February 2018 after she went on a long walk with colleagues. Over time symptoms became more frequent and now have become persistent. Symptoms started in her feet and now extended up to her bilateral knees.  Patient does report some low back pain radiating into her right leg.  Patient does report some pain in her right upper extremity between her shoulder and elbow.   REVIEW OF SYSTEMS: Full 14 system review of systems performed and  negative with exception of: Fatigue headache snoring shortness of breath cramps aching muscles decreased energy.   ALLERGIES: No Known Allergies  HOME MEDICATIONS: Outpatient Medications Prior to Visit  Medication Sig Dispense Refill  . BLACK CURRANT SEED OIL PO Take 5 mLs by mouth daily.    Marland Kitchen docusate sodium (COLACE) 100 MG capsule Take 1 capsule (100 mg total) by mouth 2 (two) times daily. 10 capsule 0  . ibuprofen (ADVIL) 200 MG tablet Take 3 tablets (600 mg total) by mouth every 6 (six) hours as needed. 30 tablet 0  . Naproxen Sodium (ALEVE) 220 MG CAPS Take 440 mg by mouth 2 (two) times daily as needed (headache).    . oxyCODONE-acetaminophen (PERCOCET/ROXICET) 5-325 MG tablet Take 1-2 tablets by mouth every 6 (six) hours as needed for moderate pain. 30 tablet 0  . senna-docusate (SENOKOT-S) 8.6-50 MG tablet Take 1 tablet by mouth at bedtime as needed for mild constipation. 15 tablet 0   No facility-administered medications prior to visit.     PAST MEDICAL HISTORY: Past Medical History:  Diagnosis Date  . Anemia   . Chills   . GERD (gastroesophageal reflux disease)   . Headache    Migraines  . Hypertension    history of    PAST SURGICAL HISTORY: Past Surgical History:  Procedure Laterality Date  . APPENDECTOMY    . COLONOSCOPY    . HYSTERECTOMY ABDOMINAL WITH SALPINGECTOMY Bilateral 10/03/2016   Procedure: HYSTERECTOMY ABDOMINAL WITH Left Salpingo oophrectomy ;  Surgeon: Azucena Fallen, MD;  Location: [facility];  Service: Gynecology;  Laterality: Bilateral;  . OOPHORECTOMY  1997    FAMILY HISTORY: Family History  Problem Relation Age of Onset  . Hypertension Mother   . Migraines Mother   .  Heart attack Brother     SOCIAL HISTORY:  Social History   Social History  . Marital status: Single    Spouse name: N/A  . Number of children: 0  . Years of education: 15   Occupational History  .      customer care, [employer]   Social History Main Topics  . Smoking status:  Never Smoker  . Smokeless tobacco: Never Used  . Alcohol use Yes     Comment: social  . Drug use: No  . Sexual activity: Not on file   Other Topics Concern  . Not on file   Social History Narrative   Lives alone   Caffeine- once a week     PHYSICAL EXAM  GENERAL EXAM/CONSTITUTIONAL: Vitals:  Vitals:   07/31/17 1443  BP: (!) 156/97  Pulse: 89  Weight: 230 lb (104.3 kg)  Height: 5\' 5"  (1.651 m)     Body mass index is 38.27 kg/m.  Visual Acuity Screening   Right eye Left eye Both eyes  Without correction: 20/70 20/40   With correction:        Patient is in no distress; well developed, nourished and groomed; neck is supple  CARDIOVASCULAR:  Examination of carotid arteries is normal; no carotid bruits  Regular rate and rhythm, no murmurs  Examination of peripheral vascular system by observation and palpation is normal  EYES:  Ophthalmoscopic exam of optic discs and posterior segments is normal; no papilledema or hemorrhages  MUSCULOSKELETAL:  Gait, strength, tone, movements noted in Neurologic exam below  NEUROLOGIC: MENTAL STATUS:  No flowsheet data found.  awake, alert, oriented to person, place and time  recent and remote memory intact  normal attention and concentration  language fluent, comprehension intact, naming intact,   fund of knowledge appropriate  CRANIAL NERVE:   2nd - no papilledema on fundoscopic exam  2nd, 3rd, 4th, 6th - pupils equal and reactive to light, visual fields full to confrontation, extraocular muscles intact, no nystagmus  5th - facial sensation symmetric  7th - facial strength symmetric  8th - hearing intact  9th - palate elevates symmetrically, uvula midline  11th - shoulder shrug symmetric  12th - tongue protrusion midline  MOTOR:   normal bulk and tone, full strength in the BUE, BLE  SENSORY:   normal and symmetric to light touch, temperature, vibration, pinprick  COORDINATION:    finger-nose-finger, fine finger movements normal  REFLEXES:   deep tendon reflexes present and symmetric  GAIT/STATION:   narrow based gait    DIAGNOSTIC DATA (LABS, IMAGING, TESTING) - I reviewed patient records, labs, notes, testing and imaging myself where available.  Lab Results  Component Value Date   WBC 8.6 10/06/2016   HGB 10.5 (L) 10/06/2016   HCT 31.9 (L) 10/06/2016   MCV 76.0 (L) 10/06/2016   PLT 291 10/06/2016      Component Value Date/Time   NA 136 10/06/2016 0746   K 3.6 10/06/2016 0746   CL 103 10/06/2016 0746   CO2 25 10/06/2016 0746   GLUCOSE 88 10/06/2016 0746   BUN 8 10/06/2016 0746   CREATININE 0.71 10/06/2016 0746   CALCIUM 8.2 (L) 10/06/2016 0746   PROT 7.2 10/06/2016 0746   ALBUMIN 3.5 10/06/2016 0746   AST 17 10/06/2016 0746   ALT 15 10/06/2016 0746   ALKPHOS 45 10/06/2016 0746   BILITOT 0.4 10/06/2016 0746   GFRNONAA >60 10/06/2016 0746   GFRAA >60 10/06/2016 0746   No results found for:  CHOL, HDL, LDLCALC, LDLDIRECT, TRIG, CHOLHDL No results found for: HGBA1C No results found for: VITAMINB12 No results found for: TSH       ASSESSMENT AND PLAN  51 y.o. year old female here with worsening headaches, new onset of right thoracic/axillary radicular pain in June 2018, new onset of a sending numbness in tingling in bilateral feet up to knees since February 2018. Most likely these represent 3 separate problems.  Dx:  1. Thoracic radiculitis   2. Chronic right-sided thoracic back pain   3. Migraine without aura and without status migrainosus, not intractable   4. Numbness and tingling of both feet      PLAN:  WORSENING HEADACHES (ddx: migraine, vascular, mass, inflamm)  - check MRI brain to rule out secondary causes - gabapentin may help  RIGHT THORACIC / AXILLARY / CHEST PAIN (ddx: disc herniation, radiculitis, or zoster sine herpete) - check MRI thoracic spine - gabapentin 300mg  at bedtime; may increase to twice a  day  BILATERAL FOOT TINGLING (ddx: neuropathy vs lumbar spinal stenosis) - neuropathy labs - may consider MRI lumbar spine - gabapentin may help  Orders Placed This Encounter  Procedures  . MR BRAIN W WO CONTRAST  . MR THORACIC SPINE W WO CONTRAST  . Hemoglobin A1c  . Vitamin B12   Meds ordered this encounter  Medications  . gabapentin (NEURONTIN) 300 MG capsule    Sig: Take 1 capsule (300 mg total) by mouth 2 (two) times daily.    Dispense:  60 capsule    Refill:  6   Return in about 3 months (around 10/31/2017).    Penni Bombard, MD 9/93/7169, 6:78 PM Certified in Neurology, Neurophysiology and Neuroimaging  [facility] [address], [place], [place] [address] [phone]

## 2017-07-31 NOTE — Patient Instructions (Addendum)
Thank you for coming to see [place] at [facility]. I hope we have been able to provide you high quality care today.  You may receive a patient satisfaction survey over the next few weeks. We would appreciate your feedback and comments so that we may continue to improve ourselves and the health of our patients.  NEW ONSET HEADACHE / MIGRAINE WITHOUT AURA - check MRI brain to rule out secondary causes - gabapentin may help  RIGHT THORACIC / AXILLARY / CHEST PAIN - check MRI thoracic spine - gabapentin 35m at bedtime; may increase to twice a day after 1-2 weeks  BILATERAL FOOT TINGLING - neuropathy labs - may consider MRI lumbar spine - gabapentin may help   ~~~~~~~~~~~~~~~~~~~~~~~~~~~~~~~~~~~~~~~~~~~~~~~~~~~~~~~~~~~~~~~~~  DR. Secilia Apps'S GUIDE TO HAPPY AND HEALTHY LIVING These are some of my general health and wellness recommendations. Some of them may apply to you better than others. Please use common sense as you try these suggestions and feel free to ask me any questions.   ACTIVITY/FITNESS Mental, social, emotional and physical stimulation are very important for brain and body health. Try learning a new activity (arts, music, language, sports, games).  Keep moving your body to the best of your abilities. You can do this at home, inside or outside, the park, community center, gym or anywhere you like. Consider a physical therapist or personal trainer to get started. Consider the app Sworkit. Fitness trackers such as smart-watches, smart-phones or Fitbits can help as well.   NUTRITION Eat more plants: colorful vegetables, nuts, seeds and berries.  Eat less sugar, salt, preservatives and processed foods.  Avoid toxins such as cigarettes and alcohol.  Drink water when you are thirsty. Warm water with a slice of lemon is an excellent morning drink to start the day.  Consider these websites for more information The Nutrition [employer]  (hhttps://www.henry-hernandez.biz/ [employer] Nutrition (wWindowBlog.ch   RELAXATION Consider practicing mindfulness meditation or other relaxation techniques such as deep breathing, prayer, yoga, tai chi, massage. See website mindful.org or the apps Headspace or Calm to help get started.   SLEEP Try to get at least 7-8+ hours sleep per day. Regular exercise and reduced caffeine will help you sleep better. Practice good sleep hygeine techniques. See website sleep.org for more information.   PLANNING Prepare estate planning, living will, healthcare POA documents. Sometimes this is best planned with the help of an attorney. Theconversationproject.org and agingwithdignity.org are excellent resources.

## 2017-08-01 LAB — HEMOGLOBIN A1C
Est. average glucose Bld gHb Est-mCnc: 120 mg/dL
HEMOGLOBIN A1C: 5.8 % — AB (ref 4.8–5.6)

## 2017-08-01 LAB — VITAMIN B12: Vitamin B-12: 1243 pg/mL (ref 232–1245)

## 2017-08-06 ENCOUNTER — Telehealth: Payer: Self-pay | Admitting: *Deleted

## 2017-08-06 NOTE — Telephone Encounter (Signed)
Attempted to reach patient with lab results. Phone continuously rang, unable to LVM.

## 2017-08-07 NOTE — Telephone Encounter (Signed)
Spoke with patient and informed her that her lab results are unremarkable. She verbalized understanding then stated the gabapentin is causing a headache about 1 hour after she takes at night. She began the medication 4 days ago, taking 300 mg at bedtime. This RN discussed that she needs to give it 1-2 weeks to see if she will adjust to medication and to realize it's full effects for her headaches, thoracic pain and neuropathy. Discussed staying hydrated, taking Tylenol as needed, but to avoid Excedrin. She stated the headaches goes away when she is up and moving. She agreed to plan. Advised she call if her headaches suddenly worsen or she has other concerns, problems. She verbalized understanding, appreciation.

## 2017-09-06 ENCOUNTER — Ambulatory Visit
Admission: RE | Admit: 2017-09-06 | Discharge: 2017-09-06 | Disposition: A | Discharge status: Home / Self Care | Payer: 59 | Source: Ambulatory Visit | Attending: Diagnostic Neuroimaging | Admitting: Diagnostic Neuroimaging

## 2017-09-06 DIAGNOSIS — G43009 Migraine without aura, not intractable, without status migrainosus: Secondary | ICD-10-CM

## 2017-09-06 DIAGNOSIS — G441 Vascular headache, not elsewhere classified: Secondary | ICD-10-CM

## 2017-09-06 DIAGNOSIS — M5414 Radiculopathy, thoracic region: Secondary | ICD-10-CM | POA: Diagnosis not present

## 2017-09-06 MED ORDER — GADOBENATE DIMEGLUMINE 529 MG/ML IV SOLN
20.0000 mL | Freq: Once | INTRAVENOUS | Status: AC | PRN
  Administered 2017-09-06: 20 mL via INTRAVENOUS

## 2017-09-12 ENCOUNTER — Telehealth: Payer: Self-pay | Admitting: *Deleted

## 2017-09-12 NOTE — Telephone Encounter (Signed)
Spoke with patient and informed her that her MRI thoracic spine showed some mild degenerative changes, and no major problems.  Advised these changes are most likely age-related. Informed her the MRI brain showed small spots of scar tissue which could be related to migraines, hypertension. Overall these were unremarkable imaging results. Advised Dr Leta Baptist will continue with her current plan. Reminded her of FU in Dec, to call with any problems, questions, concerns.

## 2017-11-05 ENCOUNTER — Ambulatory Visit: Payer: 59 | Admitting: Diagnostic Neuroimaging

## 2018-11-12 ENCOUNTER — Other Ambulatory Visit: Payer: Self-pay | Admitting: Internal Medicine

## 2018-11-12 DIAGNOSIS — Z1231 Encounter for screening mammogram for malignant neoplasm of breast: Secondary | ICD-10-CM

## 2018-12-22 ENCOUNTER — Ambulatory Visit: Payer: 59

## 2019-01-26 ENCOUNTER — Ambulatory Visit
Admission: RE | Admit: 2019-01-26 | Discharge: 2019-01-26 | Disposition: A | Discharge status: Home / Self Care | Payer: Commercial Managed Care - PPO | Source: Ambulatory Visit | Attending: Internal Medicine | Admitting: Internal Medicine

## 2019-01-26 DIAGNOSIS — Z1231 Encounter for screening mammogram for malignant neoplasm of breast: Secondary | ICD-10-CM

## 2020-01-04 ENCOUNTER — Other Ambulatory Visit: Payer: Self-pay | Admitting: Internal Medicine

## 2020-01-04 DIAGNOSIS — Z1231 Encounter for screening mammogram for malignant neoplasm of breast: Secondary | ICD-10-CM

## 2020-01-28 ENCOUNTER — Ambulatory Visit: Payer: No Typology Code available for payment source | Attending: Internal Medicine

## 2020-01-28 DIAGNOSIS — Z20822 Contact with and (suspected) exposure to covid-19: Secondary | ICD-10-CM

## 2020-01-29 LAB — NOVEL CORONAVIRUS, NAA: SARS-CoV-2, NAA: NOT DETECTED

## 2020-02-05 ENCOUNTER — Ambulatory Visit
Admission: RE | Admit: 2020-02-05 | Discharge: 2020-02-05 | Disposition: A | Discharge status: Home / Self Care | Payer: No Typology Code available for payment source | Source: Ambulatory Visit | Attending: Internal Medicine | Admitting: Internal Medicine

## 2020-02-05 ENCOUNTER — Other Ambulatory Visit: Payer: Self-pay

## 2020-02-05 DIAGNOSIS — Z1231 Encounter for screening mammogram for malignant neoplasm of breast: Secondary | ICD-10-CM

## 2020-03-25 IMAGING — MG DIGITAL SCREENING BILAT W/ TOMO W/ CAD
6 of 12 series · 6 of 36 positions shown · non-contrast
Comparison: Previous exam(s).

CLINICAL DATA: Screening.

EXAM:
DIGITAL SCREENING BILATERAL MAMMOGRAM WITH TOMO AND CAD

[R CC synth-2D (1 of 2)]
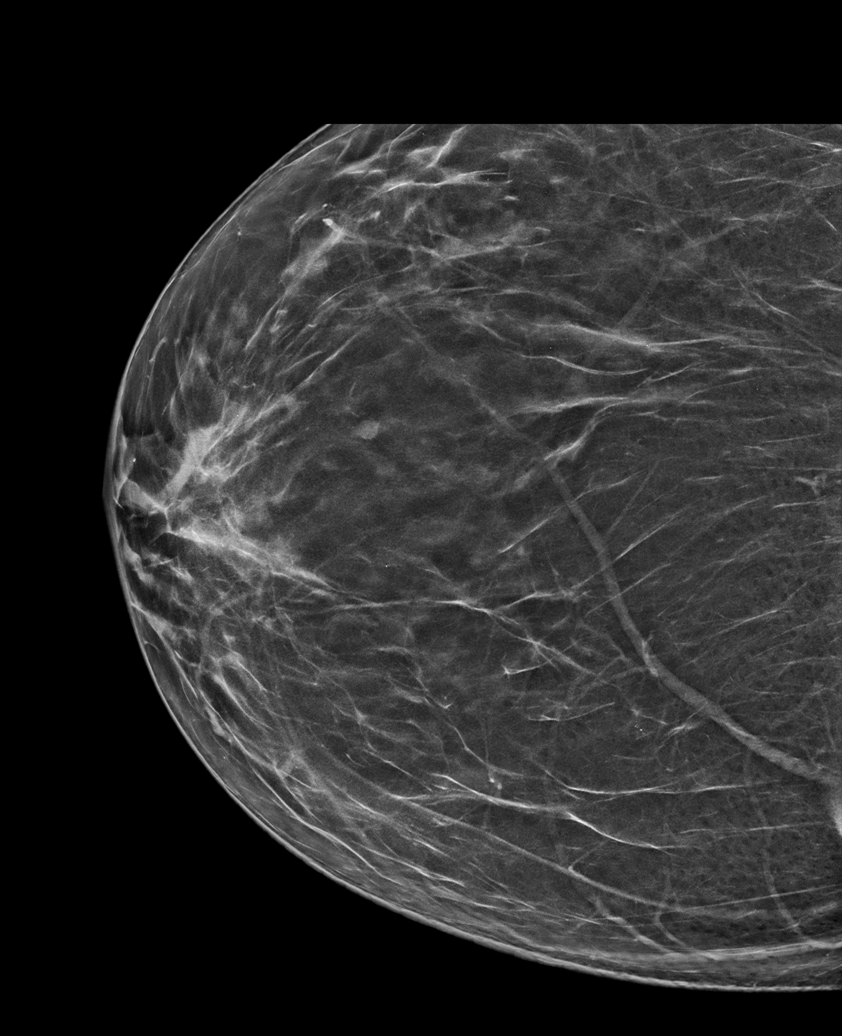

[L MLO synth-2D]
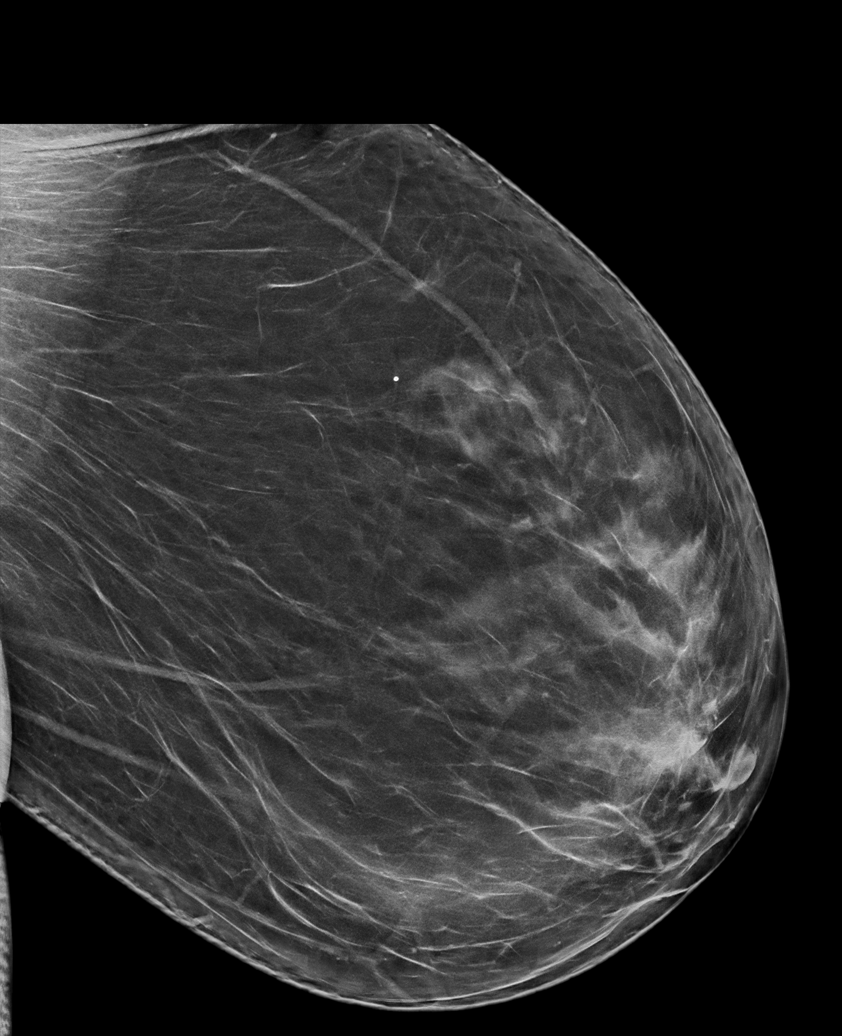

[L CC synth-2D (1 of 2)]
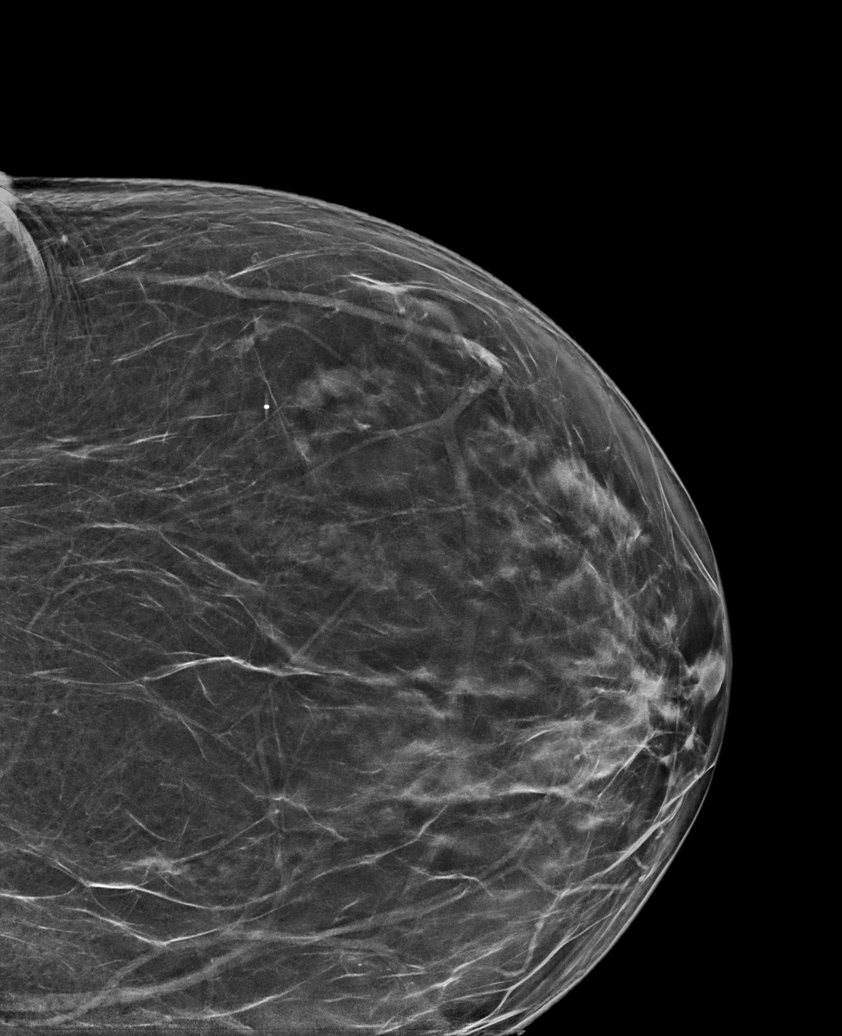

[R CC synth-2D (2 of 2)]
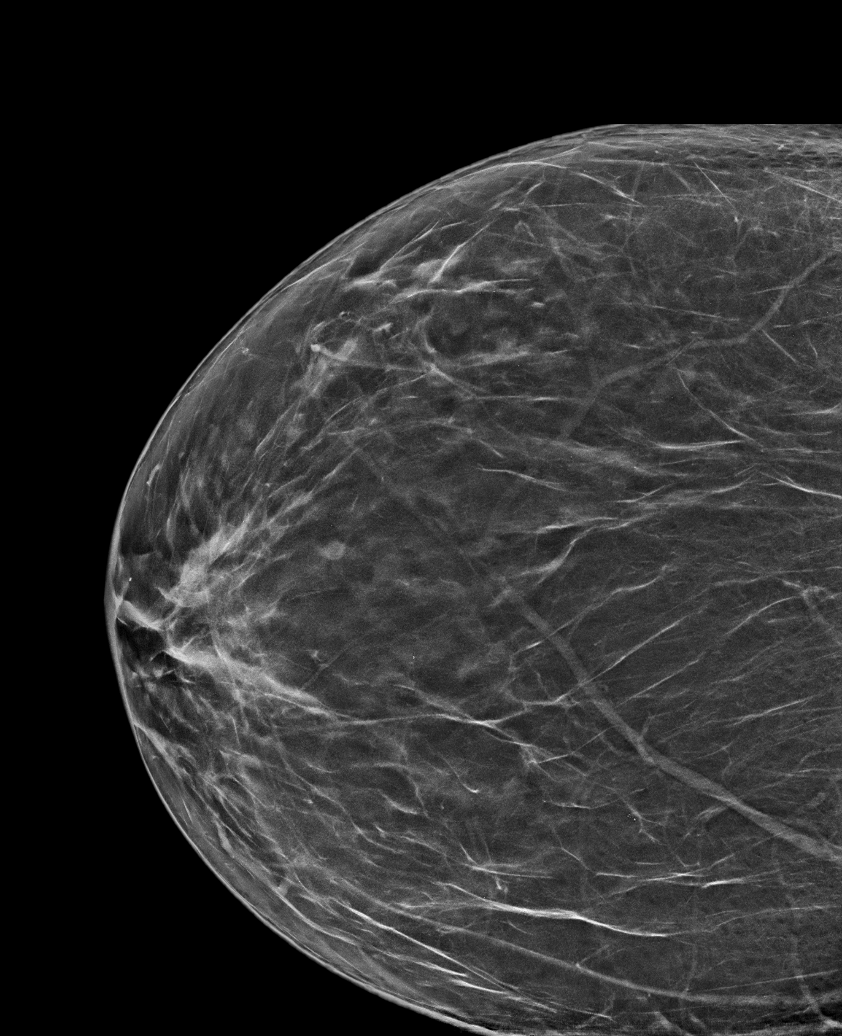

[R MLO synth-2D]
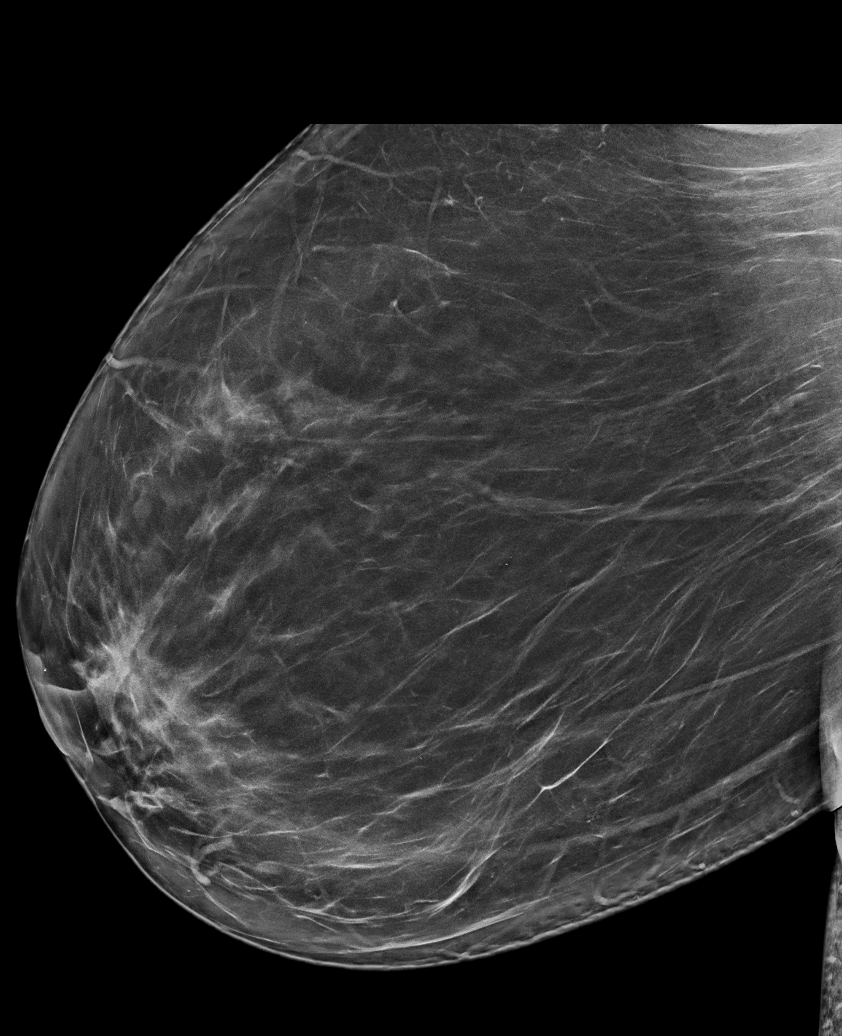

[L CC synth-2D (2 of 2)]
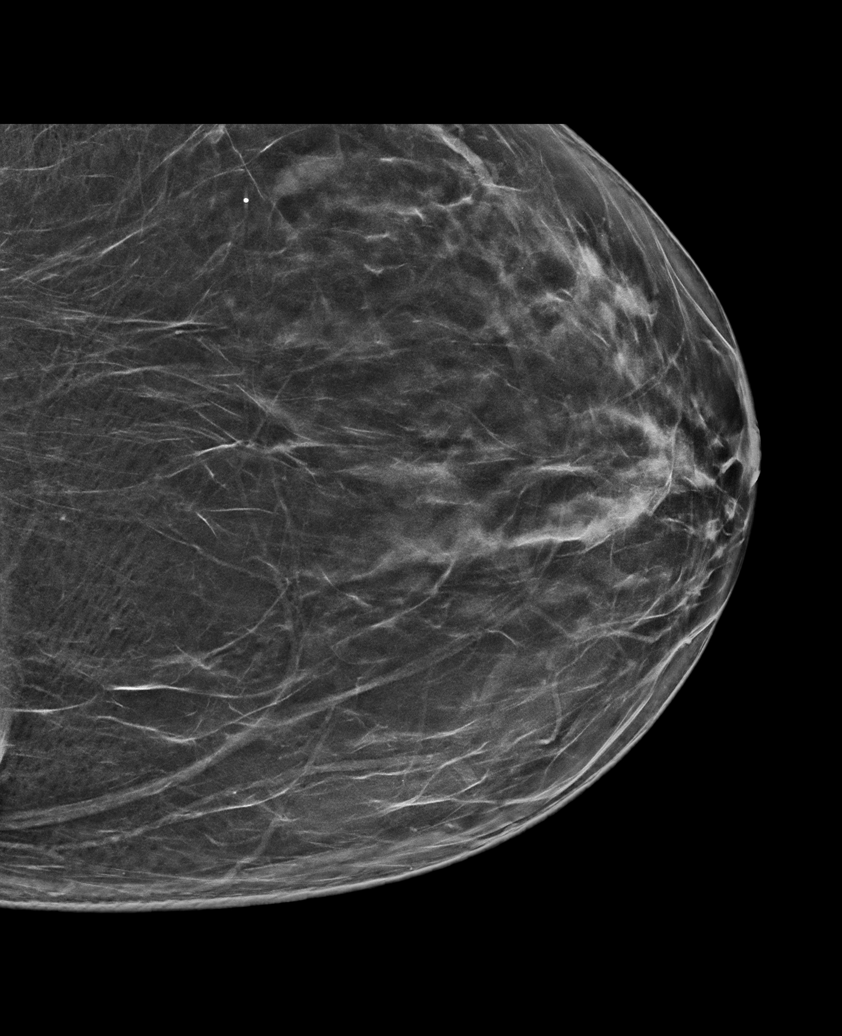

[6 of 36 positions shown; findings below may reference images not displayed]

ACR Breast Density Category b: There are scattered areas of
fibroglandular density.
FINDINGS: There are no findings suspicious for malignancy. Images were
processed with CAD.
IMPRESSION: No mammographic evidence of malignancy. A result letter of this
screening mammogram will be mailed directly to the patient.

RECOMMENDATION:
Screening mammogram in one year. (Code:[id number])

BI-RADS CATEGORY  1: Negative.

## 2020-04-07 ENCOUNTER — Ambulatory Visit (INDEPENDENT_AMBULATORY_CARE_PROVIDER_SITE_OTHER): Payer: No Typology Code available for payment source | Admitting: Pulmonary Disease

## 2020-04-07 ENCOUNTER — Encounter: Payer: Self-pay | Admitting: Pulmonary Disease

## 2020-04-07 ENCOUNTER — Other Ambulatory Visit: Payer: Self-pay | Admitting: Pulmonary Disease

## 2020-04-07 ENCOUNTER — Other Ambulatory Visit: Payer: Self-pay

## 2020-04-07 VITALS — BP 118/76 | HR 91 | Temp 98.2°F | Ht 64.0 in | Wt 226.8 lb

## 2020-04-07 DIAGNOSIS — J45991 Cough variant asthma: Secondary | ICD-10-CM

## 2020-04-07 DIAGNOSIS — R0602 Shortness of breath: Secondary | ICD-10-CM | POA: Diagnosis not present

## 2020-04-07 MED ORDER — ALBUTEROL SULFATE HFA 108 (90 BASE) MCG/ACT IN AERS: 2.0000 | INHALATION_SPRAY | Freq: Four times a day (QID) | RESPIRATORY_TRACT | 5 refills | Status: DC | PRN

## 2020-04-07 MED ORDER — FLUTICASONE-SALMETEROL 250-50 MCG/DOSE IN AEPB: 1.0000 | INHALATION_SPRAY | Freq: Two times a day (BID) | RESPIRATORY_TRACT | 5 refills | Status: DC

## 2020-04-07 MED ORDER — PREDNISONE 10 MG PO TABS: 10.0000 mg | ORAL_TABLET | Freq: Two times a day (BID) | ORAL | 0 refills | Status: DC

## 2020-04-07 NOTE — Patient Instructions (Addendum)
Shortness of breath with exertion Cough  Possible asthma Possible airway hyperactivity  Prednisone 10 p.o. twice daily for 7 days Prescription for steroid inhaler Prescription for albuterol to be used as needed  PFT in about 4 to 6 weeks  Follow-up in 6 weeks  Call with significant concerns

## 2020-04-07 NOTE — Progress Notes (Signed)
Marilyn Grimes    [id number]    1966/01/26  Primary Care Physician:Ramachandran, Mauro Kaufmann, MD  Referring Physician: Merrilee Seashore, [place],  [place] [address]  Chief complaint:   Cough shortness of breath in the last 6 to 7 months  HPI: Difficulty tolerating usual activities Exercise activities the used to be well-tolerated now because shortness of breath and cough Has had some chest tightness  Gets bronchitis on a yearly basis Inhalers in the remote past did help with resolution of the cough  Was never diagnosed with asthma  Never smoker  No changes in usual environments, no changes in exposures Office work  No pets, No recent travel   Outpatient Encounter Medications as of 04/07/2020  Medication Sig  . aspirin-acetaminophen-caffeine (EXCEDRIN MIGRAINE) 250-250-65 MG tablet Take by mouth every 6 (six) hours as needed for headache.  . ibuprofen (ADVIL) 200 MG tablet Take 3 tablets (600 mg total) by mouth every 6 (six) hours as needed.  . meloxicam (MOBIC) 15 MG tablet 15 mg daily. 07/22/17 taking x 10 days  . Naproxen Sodium (ALEVE) 220 MG CAPS Take 440 mg by mouth 2 (two) times daily as needed (headache).  . pantoprazole (PROTONIX) 40 MG tablet Take 40 mg by mouth daily.  . valsartan-hydrochlorothiazide (DIOVAN-HCT) 80-12.5 MG tablet Take 1 tablet by mouth daily.  . [DISCONTINUED] docusate sodium (COLACE) 100 MG capsule Take 1 capsule (100 mg total) by mouth 2 (two) times daily.  . [DISCONTINUED] gabapentin (NEURONTIN) 300 MG capsule Take 1 capsule (300 mg total) by mouth 2 (two) times daily.  . [DISCONTINUED] OVER THE COUNTER MEDICATION as needed. Excedrin Extra S  . [DISCONTINUED] oxyCODONE-acetaminophen (PERCOCET/ROXICET) 5-325 MG tablet Take 1-2 tablets by mouth every 6 (six) hours as needed for moderate pain.  . [DISCONTINUED] senna-docusate (SENOKOT-S) 8.6-50 MG tablet Take 1 tablet by mouth at bedtime as needed for mild  constipation.  . [DISCONTINUED] BLACK CURRANT SEED OIL PO Take 5 mLs by mouth daily.   No facility-administered encounter medications on file as of 04/07/2020.    Allergies as of 04/07/2020  . (No Known Allergies)    Past Medical History:  Diagnosis Date  . Anemia   . Chills   . GERD (gastroesophageal reflux disease)   . Headache    Migraines  . Hypertension    history of    Past Surgical History:  Procedure Laterality Date  . APPENDECTOMY    . COLONOSCOPY    . HYSTERECTOMY ABDOMINAL WITH SALPINGECTOMY Bilateral 10/03/2016   Procedure: HYSTERECTOMY ABDOMINAL WITH Left Salpingo oophrectomy ;  Surgeon: Azucena Fallen, MD;  Location: [facility];  Service: Gynecology;  Laterality: Bilateral;  . OOPHORECTOMY  1997    Family History  Problem Relation Age of Onset  . Hypertension Mother   . Migraines Mother   . Heart attack Brother     Social History   Socioeconomic History  . Marital status: Single    Spouse name: Not on file  . Number of children: 0  . Years of education: 77  . Highest education level: Not on file  Occupational History    Comment: customer care, [employer]  Tobacco Use  . Smoking status: Never Smoker  . Smokeless tobacco: Never Used  Substance and Sexual Activity  . Alcohol use: Yes    Comment: social  . Drug use: No  . Sexual activity: Not on file  Other Topics Concern  . Not on file  Social  History Narrative   Lives alone   Caffeine- once a week   Social Determinants of Health   Financial Resource Strain:   . Difficulty of Paying Living Expenses:   Food Insecurity:   . Worried About Charity fundraiser in the Last Year:   . Arboriculturist in the Last Year:   Transportation Needs:   . Film/video editor (Medical):   Marland Kitchen Lack of Transportation (Non-Medical):   Physical Activity:   . Days of Exercise per Week:   . Minutes of Exercise per Session:   Stress:   . Feeling of Stress :   Social Connections:   . Frequency of Communication with  Friends and Family:   . Frequency of Social Gatherings with Friends and Family:   . Attends Religious Services:   . Active Member of Clubs or Organizations:   . Attends Archivist Meetings:   Marland Kitchen Marital Status:   Intimate Partner Violence:   . Fear of Current or Ex-Partner:   . Emotionally Abused:   Marland Kitchen Physically Abused:   . Sexually Abused:     Review of Systems  Constitutional: Negative.   HENT: Negative.   Eyes: Negative.   Respiratory: Positive for cough and shortness of breath.   Cardiovascular: Negative.   Gastrointestinal: Negative.   All other systems reviewed and are negative.   Vitals:   04/07/20 1515  BP: 118/76  Pulse: 91  Temp: 98.2 F (36.8 C)  SpO2: 97%     Physical Exam  Constitutional: She appears well-developed.  HENT:  Head: Normocephalic and atraumatic.  Eyes: Pupils are equal, round, and reactive to light. Conjunctivae are normal.  Neck: No tracheal deviation present. No thyromegaly present.  Cardiovascular: Normal rate and regular rhythm.  Pulmonary/Chest: Effort normal and breath sounds normal. No respiratory distress. She has no wheezes. She has no rales. She exhibits no tenderness.  Musculoskeletal:     Cervical back: Normal range of motion and neck supple.   Data Reviewed: Recent chest x-ray shows no acute infiltrate  Assessment:  Possible asthma Cough variant asthma  Shortness of breath on exertion  Plan/Recommendations: Prednisone 10 p.o. twice daily for 7 days  Prescription for steroid inhaler-Advair 250  Albuterol as needed  Obtain PFT in 4 to 6 weeks  Graded exercise as tolerated   Sherrilyn Rist MD [facility] 04/07/2020, 3:22 PM  CC: Merrilee Seashore, MD

## 2020-04-25 ENCOUNTER — Telehealth: Payer: Self-pay | Admitting: Pulmonary Disease

## 2020-04-25 ENCOUNTER — Other Ambulatory Visit: Payer: Self-pay | Admitting: Pulmonary Disease

## 2020-04-25 MED ORDER — FLUTICASONE-SALMETEROL 113-14 MCG/ACT IN AEPB: 1.0000 | INHALATION_SPRAY | Freq: Two times a day (BID) | RESPIRATORY_TRACT | 3 refills | Status: DC

## 2020-04-25 MED ORDER — ALBUTEROL SULFATE HFA 108 (90 BASE) MCG/ACT IN AERS
2.0000 | INHALATION_SPRAY | Freq: Four times a day (QID) | RESPIRATORY_TRACT | 2 refills | Status: DC | PRN
Start: 2020-04-25 — End: 2020-04-25

## 2020-04-25 MED ORDER — ALBUTEROL SULFATE HFA 108 (90 BASE) MCG/ACT IN AERS: 2.0000 | INHALATION_SPRAY | Freq: Four times a day (QID) | RESPIRATORY_TRACT | 2 refills | Status: DC | PRN

## 2020-04-25 NOTE — Telephone Encounter (Signed)
Albuterol called in  It is okay for her to go ahead with a PFT on 6/22  We can try AirDuo RespiClick in place of Advair, will send prescription in

## 2020-04-25 NOTE — Telephone Encounter (Signed)
Called and spoke with pt who states she has been noticing hoarseness since 3 days ago. Pt stated she has been using the Advair properly x1 week and is wondering if it could be coming from that. Pt states she also developed a sore throat yesterday 5/23.  Asked pt if she has been running a temp and she stated she does not believe she is running a temp as she has no body aches, no chills, and does not feel hot. Pt did not have a thermometer with her to be able to check her temp. Pt has no complaints of cough or wheezing after recently being on prednisone.  Aaron Edelman, please advise for pt if the hoarseness and sore throat could be a side effect from her Advair and if so, please advise what you recommend.

## 2020-04-25 NOTE — Telephone Encounter (Signed)
04/25/2020  Could be related to patient's Advair.  It is okay if the patient wants to stop using this.  Sometimes she can get hoarseness from dry powder inhaler ICS/LABA.  As of right now I do not hear an indication to stop her pulmonary function test.  Please route this message to Dr. Ander Slade if he has additional thoughts or would like the patient trialed on a different inhaler prior to pulmonary function testing.  Wyn Quaker, FNP

## 2020-04-25 NOTE — Telephone Encounter (Signed)
Spoke with pt, aware of recs. Pt requesting that albuterol be sent to pharmacy.  Per 5/6 OV, this was recommended but not received by her pharmacy.  This has been resent.  Dr. Ander Slade, please advise on any additional recs (replacement to Advair, ok to proceed with PFT on 6/22).  Thanks!

## 2020-04-26 NOTE — Telephone Encounter (Signed)
Spoke with pt. She is aware of Dr. Judson Roch response. Nothing further was needed.

## 2020-05-21 ENCOUNTER — Other Ambulatory Visit (HOSPITAL_COMMUNITY)
Admission: RE | Admit: 2020-05-21 | Discharge: 2020-05-21 | Disposition: A | Discharge status: Home / Self Care | Payer: No Typology Code available for payment source | Source: Ambulatory Visit | Attending: Pulmonary Disease | Admitting: Pulmonary Disease

## 2020-05-21 DIAGNOSIS — Z01812 Encounter for preprocedural laboratory examination: Secondary | ICD-10-CM | POA: Insufficient documentation

## 2020-05-21 DIAGNOSIS — Z20822 Contact with and (suspected) exposure to covid-19: Secondary | ICD-10-CM | POA: Insufficient documentation

## 2020-05-21 LAB — SARS CORONAVIRUS 2 (TAT 6-24 HRS): SARS Coronavirus 2: NEGATIVE

## 2020-05-24 ENCOUNTER — Other Ambulatory Visit: Payer: Self-pay

## 2020-05-24 ENCOUNTER — Ambulatory Visit (INDEPENDENT_AMBULATORY_CARE_PROVIDER_SITE_OTHER): Payer: No Typology Code available for payment source | Admitting: Pulmonary Disease

## 2020-05-24 DIAGNOSIS — J45991 Cough variant asthma: Secondary | ICD-10-CM

## 2020-05-24 LAB — PULMONARY FUNCTION TEST
DL/VA % pred: 100 %
DL/VA: 4.27 ml/min/mmHg/L
DLCO cor % pred: 92 %
DLCO cor: 20.3 ml/min/mmHg
DLCO unc % pred: 92 %
DLCO unc: 20.3 ml/min/mmHg
FEF 25-75 Post: 5.42 L/sec
FEF 25-75 Pre: 5.4 L/sec
FEF2575-%Change-Post: 0 %
FEF2575-%Pred-Post: 221 %
FEF2575-%Pred-Pre: 220 %
FEV1-%Change-Post: 0 %
FEV1-%Pred-Post: 125 %
FEV1-%Pred-Pre: 125 %
FEV1-Post: 3.01 L
FEV1-Pre: 3.01 L
FEV1FVC-%Change-Post: 3 %
FEV1FVC-%Pred-Pre: 111 %
FEV6-%Change-Post: -3 %
FEV6-%Pred-Post: 111 %
FEV6-%Pred-Pre: 114 %
FEV6-Post: 3.24 L
FEV6-Pre: 3.34 L
FEV6FVC-%Pred-Post: 102 %
FEV6FVC-%Pred-Pre: 102 %
FVC-%Change-Post: -3 %
FVC-%Pred-Post: 107 %
FVC-%Pred-Pre: 111 %
FVC-Post: 3.24 L
FVC-Pre: 3.34 L
Post FEV1/FVC ratio: 93 %
Post FEV6/FVC ratio: 100 %
Pre FEV1/FVC ratio: 90 %
Pre FEV6/FVC Ratio: 100 %
RV % pred: 77 %
RV: 1.48 L
TLC % pred: 96 %
TLC: 5.07 L

## 2020-05-24 NOTE — Progress Notes (Signed)
PFT done today. 

## 2020-06-08 ENCOUNTER — Other Ambulatory Visit: Payer: Self-pay

## 2020-06-08 ENCOUNTER — Telehealth: Payer: Self-pay | Admitting: Acute Care

## 2020-06-08 ENCOUNTER — Ambulatory Visit: Payer: No Typology Code available for payment source | Admitting: Acute Care

## 2020-06-08 NOTE — Telephone Encounter (Signed)
Pt. Was a no show for her follow up. I have called 3 times with no answer. I have left a HIPPA compliant message letting the patient know we will reschedule her follow up.   Please re-schedule for follow up to review PFT's and assess how she is doing on her Advair inhaler. Please make an in office visit.  Thanks so much

## 2020-06-08 NOTE — Progress Notes (Signed)
Virtual Visit via Telephone Note  I connected with Marilyn Grimes on 06/08/20 at  4:00 PM EDT by telephone and verified that I am speaking with the correct person using two identifiers.  Location: Patient: At home Provider: [place], [place], [place], [address]   I discussed the limitations, risks, security and privacy concerns of performing an evaluation and management service by telephone and the availability of in person appointments. I also discussed with the patient that there may be a patient responsible charge related to this service. The patient expressed understanding and agreed to proceed.  54 year old never smoker with cough and shortness of breath. She is followed by Dr. Ander Slade.   History of Present Illness: Pt. Presents for follow up.She was seen by Dr. Ander Slade 04/07/2020 for shortness of breath over the previous 6-7 months. She had complaints of chest tightness and cough. She also had a history of frequent bronchitis. She had never been diagnosed with asthma. She was started on a prednisone 10 mg  x 7 days,  , Advair inhaler 250, rescue inhaler and PFT's were scheduled. He also encouraged graded exercise as tolerated.  She is scheduled for follow up.    Observations/Objective:   Assessment and Plan:   Follow Up Instructions:    I discussed the assessment and treatment plan with the patient. The patient was provided an opportunity to ask questions and all were answered. The patient agreed with the plan and demonstrated an understanding of the instructions.   The patient was advised to call back or seek an in-person evaluation if the symptoms worsen or if the condition fails to improve as anticipated.  I provided 0 minutes of non-face-to-face time during this encounter.   Magdalen Spatz, NP   Addendum Patient was a no show. They were not seen , which is why this is an incomplete note.

## 2020-06-08 NOTE — Telephone Encounter (Signed)
Patient stated that she was trying to call Judson Roch back and got the front desk instead for her visit.  She has been rescheduled with TP for 06/09/2020.  Advised to be ready for the clinical staff to call her 15 minutes prior to her scheduled appointment to review PFT and how she is doing with her Advair inhaler.  Nothing further needed.

## 2020-06-09 ENCOUNTER — Ambulatory Visit (INDEPENDENT_AMBULATORY_CARE_PROVIDER_SITE_OTHER): Payer: No Typology Code available for payment source | Admitting: Adult Health

## 2020-06-09 ENCOUNTER — Encounter: Payer: Self-pay | Admitting: Adult Health

## 2020-06-09 ENCOUNTER — Other Ambulatory Visit: Payer: Self-pay

## 2020-06-09 DIAGNOSIS — R0602 Shortness of breath: Secondary | ICD-10-CM

## 2020-06-09 DIAGNOSIS — J45991 Cough variant asthma: Secondary | ICD-10-CM | POA: Diagnosis not present

## 2020-06-09 NOTE — Progress Notes (Signed)
Virtual Visit via Telephone Note  I connected with Marilyn Grimes on 06/09/20 at 11:30 AM EDT by telephone and verified that I am speaking with the correct person using two identifiers.  Location: Patient: Home  Provider: Office    I discussed the limitations, risks, security and privacy concerns of performing an evaluation and management service by telephone and the availability of in person appointments. I also discussed with the patient that there may be a patient responsible charge related to this service. The patient expressed understanding and agreed to proceed.   History of Present Illness: 54 year old female never smoker seen for pulmonary consult Apr 07, 2020 for shortness of breath and cough x6 months.  Today's televisit is a 57-month follow-up  06/09/2020 Follow up : Dyspnea /Cough  Patient returns for a 23-month follow-up.  Patient was seen last visit for shortness of breath and cough x6 months.  Patient was given a prednisone burst.  Started on Advair.  Patient says she is feeling better.  Her cough has resolved.  She did not continue on Advair as she felt like it did not help her cough went away after taking prednisone.  She was set up for pulmonary function testing that was completed today that was normal with no airflow obstruction or restriction.  FEV1 was 125%, ratio was 90, FVC 111%, DLCO 92%. Patient says overall she is doing well except she continues to have this ongoing upper back pain that is worse if she stands for long period of time or if she is exposed to cold temperatures also seems to ache in the upper part of her chest as well.  She denies any GERD, orthopnea, edema, hemoptysis.  She says she had a chest x-ray a couple months ago and was reported as normal. She has rare albuterol use  Patient Active Problem List   Diagnosis Date Noted  . S/P abdominal hysterectomy 10/03/2016   Current Outpatient Medications on File Prior to Visit  Medication Sig Dispense Refill   . albuterol (VENTOLIN HFA) 108 (90 Base) MCG/ACT inhaler Inhale 2 puffs into the lungs every 6 (six) hours as needed. 18 g 5  . amLODIPine-Valsartan-HCTZ 10-160-12.5 MG TABS Take 1 tablet by mouth daily.    Marland Kitchen aspirin-acetaminophen-caffeine (EXCEDRIN MIGRAINE) 250-250-65 MG tablet Take by mouth every 6 (six) hours as needed for headache.    . ibuprofen (ADVIL) 200 MG tablet Take 3 tablets (600 mg total) by mouth every 6 (six) hours as needed. 30 tablet 0  . meloxicam (MOBIC) 15 MG tablet 15 mg daily. 07/22/17 taking x 10 days     No current facility-administered medications on file prior to visit.      Observations/Objective: Speaks in full sentences with no audible distress or wheezing  Assessment and Plan: Dyspnea questionable etiology.  Patient's cough resolved with steroids.  Pulmonary function testing shows normal lung function with no airflow obstruction or restriction.  Could have a component of reactive airways or cough variant asthma this seems to be improved.  Did not have any perceived clinical benefit from Advair. Would use albuterol as needed.  Upper torso/back pain questionable etiology could be musculoskeletal.  Of advised her to follow-up with her primary care for this for further evaluation with in person visit.  Plan  Patient Instructions  Albuterol inhaler 1 to 2 puffs every 6 hours as needed for cough or wheezing . Activity as tolerated Follow-up with primary care provider for back pain evaluation Follow-up with Dr. Hermina Staggers in 4-6 months and  As needed        Follow Up Instructions:    I discussed the assessment and treatment plan with the patient. The patient was provided an opportunity to ask questions and all were answered. The patient agreed with the plan and demonstrated an understanding of the instructions.   The patient was advised to call back or seek an in-person evaluation if the symptoms worsen or if the condition fails to improve as anticipated.  I  provided 22 minutes of non-face-to-face time during this encounter.   Rexene Edison, NP

## 2020-06-09 NOTE — Patient Instructions (Signed)
Albuterol inhaler 1 to 2 puffs every 6 hours as needed for cough or wheezing . Activity as tolerated Follow-up with primary care provider for back pain evaluation Follow-up with Dr. Hermina Staggers in 4-6 months and As needed

## 2021-01-18 ENCOUNTER — Other Ambulatory Visit: Payer: Self-pay | Admitting: Internal Medicine

## 2021-01-18 DIAGNOSIS — Z1231 Encounter for screening mammogram for malignant neoplasm of breast: Secondary | ICD-10-CM

## 2021-02-20 ENCOUNTER — Ambulatory Visit
Admission: RE | Admit: 2021-02-20 | Discharge: 2021-02-20 | Disposition: A | Discharge status: Home / Self Care | Payer: No Typology Code available for payment source | Source: Ambulatory Visit | Attending: Internal Medicine | Admitting: Internal Medicine

## 2021-02-20 ENCOUNTER — Other Ambulatory Visit: Payer: Self-pay

## 2021-02-20 DIAGNOSIS — Z1231 Encounter for screening mammogram for malignant neoplasm of breast: Secondary | ICD-10-CM

## 2021-02-21 ENCOUNTER — Other Ambulatory Visit: Payer: Self-pay | Admitting: Internal Medicine

## 2021-02-21 DIAGNOSIS — N63 Unspecified lump in unspecified breast: Secondary | ICD-10-CM

## 2021-02-28 ENCOUNTER — Other Ambulatory Visit: Payer: Self-pay | Admitting: Internal Medicine

## 2021-02-28 DIAGNOSIS — Z1231 Encounter for screening mammogram for malignant neoplasm of breast: Secondary | ICD-10-CM

## 2021-03-01 ENCOUNTER — Other Ambulatory Visit: Payer: No Typology Code available for payment source

## 2021-03-02 ENCOUNTER — Other Ambulatory Visit: Payer: Self-pay

## 2021-03-02 ENCOUNTER — Ambulatory Visit
Admission: RE | Admit: 2021-03-02 | Discharge: 2021-03-02 | Disposition: A | Discharge status: Home / Self Care | Payer: No Typology Code available for payment source | Source: Ambulatory Visit | Attending: Internal Medicine | Admitting: Internal Medicine

## 2021-03-02 DIAGNOSIS — Z1231 Encounter for screening mammogram for malignant neoplasm of breast: Secondary | ICD-10-CM

## 2021-04-14 ENCOUNTER — Ambulatory Visit: Payer: No Typology Code available for payment source

## 2021-04-14 ENCOUNTER — Other Ambulatory Visit (HOSPITAL_BASED_OUTPATIENT_CLINIC_OR_DEPARTMENT_OTHER): Payer: Self-pay

## 2021-12-29 ENCOUNTER — Other Ambulatory Visit: Payer: Self-pay

## 2021-12-29 ENCOUNTER — Ambulatory Visit: Payer: No Typology Code available for payment source | Attending: Internal Medicine

## 2021-12-29 DIAGNOSIS — Z23 Encounter for immunization: Secondary | ICD-10-CM

## 2022-01-01 ENCOUNTER — Other Ambulatory Visit (HOSPITAL_BASED_OUTPATIENT_CLINIC_OR_DEPARTMENT_OTHER): Payer: Self-pay

## 2022-01-01 MED ORDER — PFIZER COVID-19 VAC BIVALENT 30 MCG/0.3ML IM SUSP
INTRAMUSCULAR | 0 refills | Status: AC
  Filled 2022-01-01: qty 0.3, 1d supply, fill #0

## 2022-01-01 NOTE — Progress Notes (Signed)
° °  Covid-19 Vaccination Clinic  Name:  Marilyn Grimes    MRN: [id number] DOB: [birth year]  01/01/2022  Ms. Marilyn Grimes was observed post Covid-19 immunization for 15 minutes without incident. She was provided with Vaccine Information Sheet and instruction to access the V-Safe system.   Ms. Marilyn Grimes was instructed to call 911 with any severe reactions post vaccine: Difficulty breathing  Swelling of face and throat  A fast heartbeat  A bad rash all over body  Dizziness and weakness   Immunizations Administered     Name Date Dose VIS Date Route   Pfizer Covid-19 Vaccine Bivalent Booster 12/29/2021  3:58 PM 0.3 mL 08/02/2021 Intramuscular   Manufacturer: [place]   Lot: [id number]   [place]: [phone]

## 2022-05-03 ENCOUNTER — Other Ambulatory Visit: Payer: Self-pay | Admitting: Internal Medicine

## 2022-05-03 DIAGNOSIS — Z1231 Encounter for screening mammogram for malignant neoplasm of breast: Secondary | ICD-10-CM

## 2022-05-07 ENCOUNTER — Ambulatory Visit
Admission: RE | Admit: 2022-05-07 | Discharge: 2022-05-07 | Disposition: A | Discharge status: Home / Self Care | Payer: No Typology Code available for payment source | Source: Ambulatory Visit | Attending: Internal Medicine | Admitting: Internal Medicine

## 2022-05-07 DIAGNOSIS — Z1231 Encounter for screening mammogram for malignant neoplasm of breast: Secondary | ICD-10-CM

## 2023-06-28 ENCOUNTER — Other Ambulatory Visit: Payer: Self-pay | Admitting: Internal Medicine

## 2023-06-28 DIAGNOSIS — Z1231 Encounter for screening mammogram for malignant neoplasm of breast: Secondary | ICD-10-CM

## 2023-07-10 ENCOUNTER — Ambulatory Visit
Admission: RE | Admit: 2023-07-10 | Discharge: 2023-07-10 | Disposition: A | Discharge status: Home / Self Care | Payer: No Typology Code available for payment source | Source: Ambulatory Visit | Attending: Internal Medicine | Admitting: Internal Medicine

## 2023-07-10 DIAGNOSIS — Z1231 Encounter for screening mammogram for malignant neoplasm of breast: Secondary | ICD-10-CM

## 2023-07-15 ENCOUNTER — Other Ambulatory Visit: Payer: Self-pay | Admitting: Internal Medicine

## 2023-07-15 DIAGNOSIS — R928 Other abnormal and inconclusive findings on diagnostic imaging of breast: Secondary | ICD-10-CM

## 2023-07-24 ENCOUNTER — Ambulatory Visit
Admission: RE | Admit: 2023-07-24 | Discharge: 2023-07-24 | Disposition: A | Discharge status: Home / Self Care | Payer: No Typology Code available for payment source | Source: Ambulatory Visit | Attending: Internal Medicine | Admitting: Internal Medicine

## 2023-07-24 DIAGNOSIS — R928 Other abnormal and inconclusive findings on diagnostic imaging of breast: Secondary | ICD-10-CM

## 2023-08-30 ENCOUNTER — Ambulatory Visit (INDEPENDENT_AMBULATORY_CARE_PROVIDER_SITE_OTHER): Payer: No Typology Code available for payment source | Admitting: Diagnostic Neuroimaging

## 2023-08-30 ENCOUNTER — Encounter: Payer: Self-pay | Admitting: Diagnostic Neuroimaging

## 2023-08-30 VITALS — BP 136/92 | HR 79 | Ht 64.0 in | Wt 223.0 lb

## 2023-08-30 DIAGNOSIS — R200 Anesthesia of skin: Secondary | ICD-10-CM

## 2023-08-30 DIAGNOSIS — R519 Headache, unspecified: Secondary | ICD-10-CM | POA: Diagnosis not present

## 2023-08-30 DIAGNOSIS — H5712 Ocular pain, left eye: Secondary | ICD-10-CM

## 2023-08-30 NOTE — Progress Notes (Addendum)
[facility]  PATIENT: Marilyn Grimes DOB: Nov 20, 1966  REFERRING CLINICIAN: Georgianne Fick, MD  HISTORY FROM: patient  REASON FOR VISIT: new consult    HISTORICAL  CHIEF COMPLAINT:  Chief Complaint  Patient presents with   New Patient (Initial Visit)    Patient in room #7 and alone. Patient states she been having sharp pain on the left side of her head. Patient states she has pain in her right eyes that trigger numbness and pain down the left side of her body.    HISTORY OF PRESENT ILLNESS:   UPDATE (08/30/23, VRP): Since last visit, doing well until March 2024 --> left HA, left eye pain 1 per month(minutes). Then August  2024, had 1 week of left face, arm leg numbness, with some intermittent left forehead pain (minutes).   PRIOR HPI (07/31/17, VRP): 57 year old female here for evaluation of headaches, right thoracic pain, bilateral foot numbness and tingling.  Regarding headaches patient has had headaches and migraine symptoms since age 2 years old, but never officially diagnosed. Since January 2018 headaches have significantly increased in frequency. She describes unilateral pressure and pounding sensation, sometimes associated with chills sensation, with nausea, vomiting, photophobia. No specific triggering factors. Now headaches are occurring almost one time per week. She is tried over-the-counter medication without relief. This represents a change in her headache pattern.  Regarding right upper thoracic symptoms, patient developed tingling sensation in June 2018 near her right latissimus, axillary region, wrapping around her right chest. She developed itching sensation. She developed burning sensation. She was concerned about possible shingles infection although she never noticed a rash. Symptoms now are burning and painful.  Regarding her bilateral feet numbness, this started intermittently in February 2018 after she went on a long walk with colleagues.  Over time symptoms became more frequent and now have become persistent. Symptoms started in her feet and now extended up to her bilateral knees.  Patient does report some low back pain radiating into her right leg.  Patient does report some pain in her right upper extremity between her shoulder and elbow.   REVIEW OF SYSTEMS: Full 14 system review of systems performed and negative with exception of: as per HPI.   ALLERGIES: No Known Allergies  HOME MEDICATIONS: Outpatient Medications Prior to Visit  Medication Sig Dispense Refill   amLODIPine-Valsartan-HCTZ 10-160-12.5 MG TABS Take 1 tablet by mouth daily.     aspirin-acetaminophen-caffeine (EXCEDRIN MIGRAINE) 250-250-65 MG tablet Take by mouth every 6 (six) hours as needed for headache.     COVID-19 mRNA bivalent vaccine, Pfizer, (PFIZER COVID-19 VAC BIVALENT) injection Inject into the muscle. 0.3 mL 0   hydrOXYzine (ATARAX) 25 MG tablet Take 25 mg by mouth 3 (three) times daily.     ibuprofen (ADVIL) 200 MG tablet Take 3 tablets (600 mg total) by mouth every 6 (six) hours as needed. 30 tablet 0   albuterol (VENTOLIN HFA) 108 (90 Base) MCG/ACT inhaler Inhale 2 puffs into the lungs every 6 (six) hours as needed. (Patient not taking: Reported on 08/30/2023) 18 g 5   meloxicam (MOBIC) 15 MG tablet 15 mg daily. 07/22/17 taking x 10 days (Patient not taking: Reported on 08/30/2023)     No facility-administered medications prior to visit.    PAST MEDICAL HISTORY: Past Medical History:  Diagnosis Date   Anemia    Chills    GERD (gastroesophageal reflux disease)    Headache    Migraines   Hypertension    history of  PAST SURGICAL HISTORY: Past Surgical History:  Procedure Laterality Date   APPENDECTOMY     COLONOSCOPY     HYSTERECTOMY ABDOMINAL WITH SALPINGECTOMY Bilateral 10/03/2016   Procedure: HYSTERECTOMY ABDOMINAL WITH Left Salpingo oophrectomy ;  Surgeon: Shea Evans, MD;  Location: [facility];  Service: Gynecology;   Laterality: Bilateral;   OOPHORECTOMY  1997    FAMILY HISTORY: Family History  Problem Relation Age of Onset   Hypertension Mother    Migraines Mother    Heart attack Brother     SOCIAL HISTORY:  Social History   Socioeconomic History   Marital status: Single    Spouse name: Not on file   Number of children: 0   Years of education: 15   Highest education level: Not on file  Occupational History    Comment: customer care, [employer]  Tobacco Use   Smoking status: Never   Smokeless tobacco: Never  Substance and Sexual Activity   Alcohol use: Yes    Comment: social   Drug use: No   Sexual activity: Not on file  Other Topics Concern   Not on file  Social History Narrative   Lives alone   Caffeine- once a week   Social Determinants of Health   Financial Resource Strain: Not on file  Food Insecurity: Not on file  Transportation Needs: Not on file  Physical Activity: Not on file  Stress: Not on file  Social Connections: Not on file  Intimate Partner Violence: Not on file     PHYSICAL EXAM  GENERAL EXAM/CONSTITUTIONAL: Vitals:  Vitals:   08/30/23 0918  BP: (!) 136/92  Pulse: 79  Weight: 223 lb (101.2 kg)  Height: 5\' 4"  (1.626 m)   Body mass index is 38.28 kg/m. No results found.  Patient is in no distress; well developed, nourished and groomed; neck is supple  CARDIOVASCULAR: Examination of carotid arteries is normal; no carotid bruits Regular rate and rhythm, no murmurs Examination of peripheral vascular system by observation and palpation is normal  EYES: Ophthalmoscopic exam of optic discs and posterior segments is normal; no papilledema or hemorrhages  MUSCULOSKELETAL: Gait, strength, tone, movements noted in Neurologic exam below  NEUROLOGIC: MENTAL STATUS:      No data to display         awake, alert, oriented to person, place and time recent and remote memory intact normal attention and concentration language fluent, comprehension  intact, naming intact,  fund of knowledge appropriate  CRANIAL NERVE:  2nd - no papilledema on fundoscopic exam 2nd, 3rd, 4th, 6th - pupils equal and reactive to light, visual fields full to confrontation, extraocular muscles intact, no nystagmus 5th - facial sensation symmetric 7th - facial strength symmetric 8th - hearing intact 9th - palate elevates symmetrically, uvula midline 11th - shoulder shrug symmetric 12th - tongue protrusion midline  MOTOR:  normal bulk and tone, full strength in the BUE, BLE  SENSORY:  normal and symmetric to light touch, temperature, vibration COORDINATION:  finger-nose-finger, fine finger movements normal  REFLEXES:  deep tendon reflexes present and symmetric  GAIT/STATION:  narrow based gait    DIAGNOSTIC DATA (LABS, IMAGING, TESTING) - I reviewed patient records, labs, notes, testing and imaging myself where available.  Lab Results  Component Value Date   WBC 8.6 10/06/2016   HGB 10.5 (L) 10/06/2016   HCT 31.9 (L) 10/06/2016   MCV 76.0 (L) 10/06/2016   PLT 291 10/06/2016      Component Value Date/Time   NA 136 10/06/2016 0746  K 3.6 10/06/2016 0746   CL 103 10/06/2016 0746   CO2 25 10/06/2016 0746   GLUCOSE 88 10/06/2016 0746   BUN 8 10/06/2016 0746   CREATININE 0.71 10/06/2016 0746   CALCIUM 8.2 (L) 10/06/2016 0746   PROT 7.2 10/06/2016 0746   ALBUMIN 3.5 10/06/2016 0746   AST 17 10/06/2016 0746   ALT 15 10/06/2016 0746   ALKPHOS 45 10/06/2016 0746   BILITOT 0.4 10/06/2016 0746   GFRNONAA >60 10/06/2016 0746   GFRAA >60 10/06/2016 0746   No results found for: "CHOL", "HDL", "LDLCALC", "LDLDIRECT", "TRIG", "CHOLHDL" Lab Results  Component Value Date   HGBA1C 5.8 (H) 07/31/2017   Lab Results  Component Value Date   VITAMINB12 1,243 07/31/2017   No results found for: "TSH"    09/06/17 MRI of the brain with and without contrast shows the following: 1.   A few scattered T2/FLAIR hyperintense foci in the subcortical  white matter of the frontal lobes consistent with age-appropriate chronic microvascular ischemic change.   None of these appear to be acute. 2.   There is a normal enhancement pattern and there are no acute findings.  09/06/17 MRI of the thoracic spine with and without contrast shows the following: 1.    The spinal cord appears normal. 2.    At T7-T8, there is a right paramedian disc protrusion and mild uncovertebral spurring to the right that slightly distorts the thecal sac and causes mild right foraminal narrowing without leading to spinal cord compression or nerve root compression. 3.    There are mild disc bulges at T5-T6, T6-T7, T8-T9 and T9-T10 without any nerve root compression. 4.    There is a normal enhancement pattern.     ASSESSMENT AND PLAN  57 y.o. year old female here with worsening headaches and left sided numbness.   Dx:  1. Left temporal headache   2. Left eye pain   3. Left facial numbness   4. Left arm numbness   5. Left leg numbness      PLAN:  LEFT FACE, ARM, LEG NUMBNESS + left sided headaches + left eye pain - check MRI brain and labs (rule out temporal arteritis, autoimmune causes)  MIGRAINE WITHOUT AURA (since age 71 year old; rare) - consider ibuprofen, tylenol  - consider migraine prevention if increasing   Orders Placed This Encounter  Procedures   MR BRAIN W WO CONTRAST   Sedimentation Rate   C-reactive Protein   ANA,IFA RA Diag Pnl w/rflx Tit/Patn   Sjogren's syndrome antibods(ssa + ssb)   Hemoglobin A1c   Vitamin B12   TSH Rfx on Abnormal to Free T4    Return for pending if symptoms worsen or fail to improve, pending test results.    Suanne Marker, MD 08/30/2023, 9:49 AM Certified in Neurology, Neurophysiology and Neuroimaging  [facility] [address], [address] [place], [place] [address] [phone]

## 2023-08-30 NOTE — Patient Instructions (Signed)
  LEFT FACE, ARM, LEG NUMBNESS + left sided headaches + left eye pain - check MRI brain and labs (rule out temporal arteritis, autoimmune causes)  MIGRAINE WITHOUT AURA (since age 57 year old) - consider ibuprofen, tylenol  - consider migraine prevention if increasing

## 2023-08-31 LAB — TSH RFX ON ABNORMAL TO FREE T4: TSH: 1.84 u[IU]/mL (ref 0.450–4.500)

## 2023-09-03 ENCOUNTER — Telehealth: Payer: Self-pay | Admitting: Diagnostic Neuroimaging

## 2023-09-03 NOTE — Telephone Encounter (Signed)
[employer] surest NPR sent to [facility] [phone] 

## 2023-09-04 LAB — ANA,IFA RA DIAG PNL W/RFLX TIT/PATN
Cyclic Citrullin Peptide Ab: 9 U (ref 0–19)
Rheumatoid fact SerPl-aCnc: 12.1 [IU]/mL (ref <14.0)

## 2023-09-04 LAB — SJOGREN'S SYNDROME ANTIBODS(SSA + SSB)
ENA SSA (RO) Ab: <0.2 AI (ref 0.0–0.9)
ENA SSB (LA) Ab: <0.2 AI (ref 0.0–0.9)

## 2023-09-04 LAB — SEDIMENTATION RATE: Sed Rate: 19 mm/h (ref 0–40)

## 2023-09-04 LAB — FANA STAINING PATTERNS: Homogeneous Pattern: 1:80 {titer}

## 2023-09-04 LAB — HEMOGLOBIN A1C
Est. average glucose Bld gHb Est-mCnc: 126 mg/dL
Hgb A1c MFr Bld: 6 % — ABNORMAL HIGH (ref 4.8–5.6)

## 2023-09-04 LAB — C-REACTIVE PROTEIN: CRP: 13 mg/L — ABNORMAL HIGH (ref 0–10)

## 2023-09-04 LAB — VITAMIN B12: Vitamin B-12: 416 pg/mL (ref 232–1245)

## 2023-09-10 ENCOUNTER — Telehealth: Payer: Self-pay | Admitting: Diagnostic Neuroimaging

## 2023-09-10 NOTE — Telephone Encounter (Signed)
At 11:26 a.m. pt left a vm asking to be called with results to blood work from previous office visit.

## 2023-09-24 ENCOUNTER — Telehealth: Payer: Self-pay | Admitting: Anesthesiology

## 2023-09-24 NOTE — Telephone Encounter (Signed)
-----   Message from [facility] sent at 09/23/2023  6:34 PM EDT ----- Borderline A1c.  Also positive ANA, which is nonspecific.  Sed rate is normal.  CRP slightly elevated.  Follow-up with PCP and consider rheumatology consult. Please call patient. -VRP

## 2023-10-15 ENCOUNTER — Other Ambulatory Visit: Payer: No Typology Code available for payment source

## 2023-10-15 ENCOUNTER — Ambulatory Visit
Admission: RE | Admit: 2023-10-15 | Discharge: 2023-10-15 | Disposition: A | Discharge status: Home / Self Care | Payer: No Typology Code available for payment source | Source: Ambulatory Visit | Attending: Diagnostic Neuroimaging | Admitting: Diagnostic Neuroimaging

## 2023-10-15 DIAGNOSIS — H5712 Ocular pain, left eye: Secondary | ICD-10-CM

## 2023-10-15 DIAGNOSIS — R519 Headache, unspecified: Secondary | ICD-10-CM

## 2023-10-15 DIAGNOSIS — R200 Anesthesia of skin: Secondary | ICD-10-CM

## 2023-10-15 MED ORDER — GADOPICLENOL 0.5 MMOL/ML IV SOLN
10.0000 mL | Freq: Once | INTRAVENOUS | Status: AC | PRN
  Administered 2023-10-15: 10 mL via INTRAVENOUS

## 2023-11-14 ENCOUNTER — Telehealth: Payer: Self-pay | Admitting: Diagnostic Neuroimaging

## 2023-11-14 NOTE — Telephone Encounter (Signed)
Spoke to pt gave MRI results pt expressed understanding and thanked me for calling

## 2023-11-14 NOTE — Telephone Encounter (Signed)
Per Dr Marjory Lies  "Unremarkable imaging results. Continue current plan."

## 2023-11-14 NOTE — Telephone Encounter (Signed)
Pt calling to get results of MRI done las month. Requesting call back

## 2023-11-18 NOTE — Telephone Encounter (Signed)
Noted  

## 2024-02-24 NOTE — Progress Notes (Unsigned)
 Patient: Marilyn Grimes Date of Birth: [birth year]  Reason for Visit: Follow up History from: Patient Primary Neurologist: Penumalli   ASSESSMENT AND PLAN 58 y.o. year old female with left face, arm numbness, left-sided headache for at least 1 year.  History of migraine with aura since age 17, very rare migraine. MRI of the brain with and without contrast showed few small stable punctate foci, no change from 2018.  Extensive laboratory evaluation sed rate, CRP, ANA, Sjogren's, A1c, B12, TSH showed positive ANA, A1c 6.0.  Recommended to follow-up with PCP and consider rheumatology consult.  Continues with numbness to the left face, neck, shoulder, arm and headache.  -Check MRI cervical spine -Try Cymbalta 30 mg daily for headache, paresthesia, anxiety -Follow-up with PCP about positive ANA, A1c 6.0, CRP mildly elevated.  Consider rheumatology consult -Depending on results of MRI, we will determine next follow-up   MRI brain (with and without) demonstrating: -Few small stable punctate foci of nonspecific T2 hyperintensities / gliosis. -No acute findings.  No change from 2018.  HISTORY OF PRESENT ILLNESS: Today 02/25/24 MRI of the brain with and without contrast showed few small stable punctate foci, no change from 2018.  Extensive laboratory evaluation sed rate, CRP, ANA, Sjogren's, A1c, B12, TSH showed positive ANA, A1c 6.0.  Recommended to follow-up with PCP and consider rheumatology consult.   Here today, at night when she lies down has pain and numbness to the left arm. Worse when she lays on the right side vs the left. Will wake up with headache to the left side of her face. Pain to left medial thigh to touch. Has constant numbness to her left face. Has tried Excedrin migraine, Tylenol. Intermittent stabbing pain different areas of the head, feels sinus related, weather related. Most concerning is numbness to left face, neck, arm. Makes her anxious. When she goes to bed at night very  anxious about the numbness. PCP has given Atarax PRN for anxiety.   HISTORY  UPDATE (08/30/23, VRP): Since last visit, doing well until March 2024 --> left HA, left eye pain 1 per month(minutes). Then August  2024, had 1 week of left face, arm leg numbness, with some intermittent left forehead pain (minutes).    PRIOR HPI (07/31/17, VRP): 58 year old female here for evaluation of headaches, right thoracic pain, bilateral foot numbness and tingling.   Regarding headaches patient has had headaches and migraine symptoms since age 19 years old, but never officially diagnosed. Since January 2018 headaches have significantly increased in frequency. She describes unilateral pressure and pounding sensation, sometimes associated with chills sensation, with nausea, vomiting, photophobia. No specific triggering factors. Now headaches are occurring almost one time per week. She is tried over-the-counter medication without relief. This represents a change in her headache pattern.   Regarding right upper thoracic symptoms, patient developed tingling sensation in June 2018 near her right latissimus, axillary region, wrapping around her right chest. She developed itching sensation. She developed burning sensation. She was concerned about possible shingles infection although she never noticed a rash. Symptoms now are burning and painful.   Regarding her bilateral feet numbness, this started intermittently in February 2018 after she went on a long walk with colleagues. Over time symptoms became more frequent and now have become persistent. Symptoms started in her feet and now extended up to her bilateral knees.   Patient does report some low back pain radiating into her right leg.   Patient does report some pain in her right upper extremity  between her shoulder and elbow.  REVIEW OF SYSTEMS: Out of a complete 14 system review of symptoms, the patient complains only of the following symptoms, and all other reviewed  systems are negative.  See HPI  ALLERGIES: No Known Allergies  HOME MEDICATIONS: Outpatient Medications Prior to Visit  Medication Sig Dispense Refill   amLODIPine-Valsartan-HCTZ 10-160-12.5 MG TABS Take 1 tablet by mouth daily.     aspirin-acetaminophen-caffeine (EXCEDRIN MIGRAINE) 250-250-65 MG tablet Take by mouth every 6 (six) hours as needed for headache.     COVID-19 mRNA bivalent vaccine, Pfizer, (PFIZER COVID-19 VAC BIVALENT) injection Inject into the muscle. 0.3 mL 0   hydrOXYzine (ATARAX) 25 MG tablet Take 25 mg by mouth 3 (three) times daily.     ibuprofen (ADVIL) 200 MG tablet Take 3 tablets (600 mg total) by mouth every 6 (six) hours as needed. 30 tablet 0   albuterol (VENTOLIN HFA) 108 (90 Base) MCG/ACT inhaler Inhale 2 puffs into the lungs every 6 (six) hours as needed. (Patient not taking: Reported on 08/30/2023) 18 g 5   meloxicam (MOBIC) 15 MG tablet 15 mg daily. 07/22/17 taking x 10 days (Patient not taking: Reported on 08/30/2023)     No facility-administered medications prior to visit.    PAST MEDICAL HISTORY: Past Medical History:  Diagnosis Date   Anemia    Chills    GERD (gastroesophageal reflux disease)    Headache    Migraines   Hypertension    history of    PAST SURGICAL HISTORY: Past Surgical History:  Procedure Laterality Date   APPENDECTOMY     COLONOSCOPY     HYSTERECTOMY ABDOMINAL WITH SALPINGECTOMY Bilateral 10/03/2016   Procedure: HYSTERECTOMY ABDOMINAL WITH Left Salpingo oophrectomy ;  Surgeon: Shea Evans, MD;  Location: [facility];  Service: Gynecology;  Laterality: Bilateral;   OOPHORECTOMY  1997    FAMILY HISTORY: Family History  Problem Relation Age of Onset   Hypertension Mother    Migraines Mother    Heart attack Brother     SOCIAL HISTORY: Social History   Socioeconomic History   Marital status: Single    Spouse name: Not on file   Number of children: 0   Years of education: 15   Highest education level: Not on file   Occupational History    Comment: customer care, [employer]  Tobacco Use   Smoking status: Never   Smokeless tobacco: Never  Substance and Sexual Activity   Alcohol use: Yes    Comment: social   Drug use: No   Sexual activity: Not on file  Other Topics Concern   Not on file  Social History Narrative   Lives alone   Caffeine- once a week   Social Drivers of Corporate investment banker Strain: Not on file  Food Insecurity: Not on file  Transportation Needs: Not on file  Physical Activity: Not on file  Stress: Not on file  Social Connections: Not on file  Intimate Partner Violence: Not on file    PHYSICAL EXAM  Vitals:   02/25/24 1433  BP: 125/82  Pulse: 97  Weight: 228 lb 8 oz (103.6 kg)  Height: 5' 4.5" (1.638 m)   Body mass index is 38.62 kg/m.  Generalized: Well developed, in no acute distress  Neurological examination  Mentation: Alert oriented to time, place, history taking. Follows all commands speech and language fluent Cranial nerve II-XII: Pupils were equal round reactive to light. Extraocular movements were full, visual field were full on confrontational test.  Facial sensation and strength were normal.  Head turning and shoulder shrug  were normal and symmetric. Motor: The motor testing reveals 5 over 5 strength of all 4 extremities. Good symmetric motor tone is noted throughout.  Sensory: Sensory testing is intact to soft touch on all 4 extremities. No evidence of extinction is noted.  Coordination: Cerebellar testing reveals good finger-nose-finger and heel-to-shin bilaterally.  Gait and station: Gait is normal. Tandem gait is normal.  Reflexes: Deep tendon reflexes are symmetric and normal bilaterally.   DIAGNOSTIC DATA (LABS, IMAGING, TESTING) - I reviewed patient records, labs, notes, testing and imaging myself where available.  Lab Results  Component Value Date   WBC 8.6 10/06/2016   HGB 10.5 (L) 10/06/2016   HCT 31.9 (L) 10/06/2016   MCV 76.0 (L)  10/06/2016   PLT 291 10/06/2016      Component Value Date/Time   NA 136 10/06/2016 0746   K 3.6 10/06/2016 0746   CL 103 10/06/2016 0746   CO2 25 10/06/2016 0746   GLUCOSE 88 10/06/2016 0746   BUN 8 10/06/2016 0746   CREATININE 0.71 10/06/2016 0746   CALCIUM 8.2 (L) 10/06/2016 0746   PROT 7.2 10/06/2016 0746   ALBUMIN 3.5 10/06/2016 0746   AST 17 10/06/2016 0746   ALT 15 10/06/2016 0746   ALKPHOS 45 10/06/2016 0746   BILITOT 0.4 10/06/2016 0746   GFRNONAA >60 10/06/2016 0746   GFRAA >60 10/06/2016 0746   No results found for: "CHOL", "HDL", "LDLCALC", "LDLDIRECT", "TRIG", "CHOLHDL" Lab Results  Component Value Date   HGBA1C 6.0 (H) 08/30/2023   Lab Results  Component Value Date   VITAMINB12 416 08/30/2023   Lab Results  Component Value Date   TSH 1.840 08/30/2023    Margie Ege, AGNP-C, DNP 02/25/2024, 3:22 PM [facility] [address], [address] [place], [place] [address] [phone]

## 2024-02-25 ENCOUNTER — Ambulatory Visit (INDEPENDENT_AMBULATORY_CARE_PROVIDER_SITE_OTHER): Admitting: Neurology

## 2024-02-25 ENCOUNTER — Encounter: Payer: Self-pay | Admitting: Neurology

## 2024-02-25 VITALS — BP 125/82 | HR 97 | Ht 64.5 in | Wt 228.5 lb

## 2024-02-25 DIAGNOSIS — R202 Paresthesia of skin: Secondary | ICD-10-CM | POA: Diagnosis not present

## 2024-02-25 DIAGNOSIS — G44039 Episodic paroxysmal hemicrania, not intractable: Secondary | ICD-10-CM

## 2024-02-25 DIAGNOSIS — R200 Anesthesia of skin: Secondary | ICD-10-CM | POA: Diagnosis not present

## 2024-02-25 DIAGNOSIS — R519 Headache, unspecified: Secondary | ICD-10-CM | POA: Insufficient documentation

## 2024-02-25 MED ORDER — DULOXETINE HCL 30 MG PO CPEP: 30.0000 mg | ORAL_CAPSULE | Freq: Every day | ORAL | 3 refills | Status: AC

## 2024-02-25 NOTE — Patient Instructions (Addendum)
 Start Cymbalta 30 mg daily to help with headache, paresthesia , anxiety   Order MRI cervical spine for headaches, neck pain, numbness  Follow up with primary care about labs positive ANA, A1c 6.0. Consider rheumatology consult

## 2024-03-13 ENCOUNTER — Telehealth: Payer: Self-pay | Admitting: Neurology

## 2024-03-13 NOTE — Telephone Encounter (Signed)
 Pt is asking for a call to discuss locations as to where she can have her MRI done, please call to discuss.

## 2024-03-16 NOTE — Telephone Encounter (Signed)
 She is scheduled at GI on 4/25.

## 2024-03-27 ENCOUNTER — Ambulatory Visit
Admission: RE | Admit: 2024-03-27 | Discharge: 2024-03-27 | Disposition: A | Discharge status: Home / Self Care | Source: Ambulatory Visit | Attending: Neurology | Admitting: Neurology

## 2024-03-27 DIAGNOSIS — R200 Anesthesia of skin: Secondary | ICD-10-CM | POA: Diagnosis not present

## 2024-03-31 ENCOUNTER — Encounter: Payer: Self-pay | Admitting: Neurology

## 2024-06-24 ENCOUNTER — Other Ambulatory Visit: Payer: Self-pay | Admitting: Internal Medicine

## 2024-06-24 DIAGNOSIS — Z1231 Encounter for screening mammogram for malignant neoplasm of breast: Secondary | ICD-10-CM

## 2024-07-10 ENCOUNTER — Ambulatory Visit

## 2024-07-15 ENCOUNTER — Ambulatory Visit
Admission: RE | Admit: 2024-07-15 | Discharge: 2024-07-15 | Disposition: A | Discharge status: Home / Self Care | Source: Ambulatory Visit | Attending: Internal Medicine | Admitting: Internal Medicine

## 2024-07-15 DIAGNOSIS — Z1231 Encounter for screening mammogram for malignant neoplasm of breast: Secondary | ICD-10-CM

## 2024-07-20 ENCOUNTER — Other Ambulatory Visit: Payer: Self-pay | Admitting: Internal Medicine

## 2024-07-20 DIAGNOSIS — R928 Other abnormal and inconclusive findings on diagnostic imaging of breast: Secondary | ICD-10-CM

## 2024-07-30 ENCOUNTER — Ambulatory Visit
Admission: RE | Admit: 2024-07-30 | Discharge: 2024-07-30 | Disposition: A | Discharge status: Home / Self Care | Source: Ambulatory Visit | Attending: Internal Medicine | Admitting: Internal Medicine

## 2024-07-30 ENCOUNTER — Other Ambulatory Visit: Payer: Self-pay | Admitting: Internal Medicine

## 2024-07-30 DIAGNOSIS — R928 Other abnormal and inconclusive findings on diagnostic imaging of breast: Secondary | ICD-10-CM

## 2024-07-30 DIAGNOSIS — N6489 Other specified disorders of breast: Secondary | ICD-10-CM

## 2024-07-30 DIAGNOSIS — R599 Enlarged lymph nodes, unspecified: Secondary | ICD-10-CM

## 2024-08-04 ENCOUNTER — Other Ambulatory Visit: Payer: Self-pay | Admitting: Internal Medicine

## 2024-08-04 ENCOUNTER — Ambulatory Visit
Admission: RE | Admit: 2024-08-04 | Discharge: 2024-08-04 | Disposition: A | Discharge status: Home / Self Care | Source: Ambulatory Visit | Attending: Internal Medicine | Admitting: Internal Medicine

## 2024-08-04 DIAGNOSIS — R599 Enlarged lymph nodes, unspecified: Secondary | ICD-10-CM

## 2024-08-04 DIAGNOSIS — N6489 Other specified disorders of breast: Secondary | ICD-10-CM

## 2024-08-04 HISTORY — PX: BREAST BIOPSY: SHX20

## 2024-08-06 LAB — SURGICAL PATHOLOGY

## 2024-08-10 ENCOUNTER — Ambulatory Visit
Admission: RE | Admit: 2024-08-10 | Discharge: 2024-08-10 | Disposition: A | Discharge status: Home / Self Care | Source: Ambulatory Visit | Attending: Internal Medicine | Admitting: Internal Medicine

## 2024-08-10 ENCOUNTER — Other Ambulatory Visit: Payer: Self-pay | Admitting: Internal Medicine

## 2024-08-10 DIAGNOSIS — N6489 Other specified disorders of breast: Secondary | ICD-10-CM

## 2024-08-11 ENCOUNTER — Other Ambulatory Visit: Payer: Self-pay | Admitting: Internal Medicine

## 2024-08-11 DIAGNOSIS — N6489 Other specified disorders of breast: Secondary | ICD-10-CM

## 2025-02-08 ENCOUNTER — Encounter
